# Patient Record
Sex: Male | Born: 1935 | Race: Black or African American | Hispanic: No | Smoking: Former smoker
Health system: Southern US, Community
[De-identification: ages and names within clinical notes are randomized; demographics above are authoritative.]

## PROBLEM LIST (undated history)

## (undated) DIAGNOSIS — J8410 Pulmonary fibrosis, unspecified: Secondary | ICD-10-CM

## (undated) DIAGNOSIS — I10 Essential (primary) hypertension: Secondary | ICD-10-CM

## (undated) DIAGNOSIS — R413 Other amnesia: Secondary | ICD-10-CM

## (undated) DIAGNOSIS — M109 Gout, unspecified: Secondary | ICD-10-CM

## (undated) DIAGNOSIS — Z950 Presence of cardiac pacemaker: Secondary | ICD-10-CM

## (undated) DIAGNOSIS — N400 Enlarged prostate without lower urinary tract symptoms: Secondary | ICD-10-CM

## (undated) DIAGNOSIS — I495 Sick sinus syndrome: Secondary | ICD-10-CM

## (undated) DIAGNOSIS — N189 Chronic kidney disease, unspecified: Secondary | ICD-10-CM

## (undated) DIAGNOSIS — D649 Anemia, unspecified: Secondary | ICD-10-CM

## (undated) DIAGNOSIS — R001 Bradycardia, unspecified: Secondary | ICD-10-CM

## (undated) DIAGNOSIS — I509 Heart failure, unspecified: Secondary | ICD-10-CM

## (undated) HISTORY — DX: Heart failure, unspecified: I50.9

## (undated) HISTORY — PX: CATARACT EXTRACTION: SUR2

## (undated) HISTORY — DX: Presence of cardiac pacemaker: Z95.0

## (undated) HISTORY — PX: PACEMAKER INSERTION: SHX728

## (undated) HISTORY — DX: Other amnesia: R41.3

---

## 2008-01-25 ENCOUNTER — Ambulatory Visit: Payer: Self-pay | Admitting: Family Medicine

## 2008-02-11 ENCOUNTER — Ambulatory Visit: Payer: Self-pay | Admitting: Gastroenterology

## 2008-10-03 DIAGNOSIS — Z950 Presence of cardiac pacemaker: Secondary | ICD-10-CM

## 2008-10-03 HISTORY — DX: Presence of cardiac pacemaker: Z95.0

## 2008-11-08 ENCOUNTER — Emergency Department: Payer: Self-pay | Admitting: Emergency Medicine

## 2008-11-14 ENCOUNTER — Inpatient Hospital Stay: Payer: Self-pay | Admitting: Cardiology

## 2008-12-04 ENCOUNTER — Emergency Department: Payer: Self-pay | Admitting: Emergency Medicine

## 2009-04-28 ENCOUNTER — Ambulatory Visit: Payer: Self-pay | Admitting: Urology

## 2009-10-23 ENCOUNTER — Emergency Department: Payer: Self-pay | Admitting: Emergency Medicine

## 2010-09-13 ENCOUNTER — Emergency Department: Payer: Self-pay | Admitting: Emergency Medicine

## 2010-11-02 ENCOUNTER — Ambulatory Visit: Payer: Self-pay | Admitting: Urology

## 2011-03-25 ENCOUNTER — Ambulatory Visit: Payer: Self-pay | Admitting: Gastroenterology

## 2011-03-29 LAB — PATHOLOGY REPORT

## 2012-02-07 ENCOUNTER — Emergency Department: Payer: Self-pay | Admitting: Emergency Medicine

## 2012-02-07 LAB — COMPREHENSIVE METABOLIC PANEL
Anion Gap: 5 — ABNORMAL LOW (ref 7–16)
BUN: 13 mg/dL (ref 7–18)
Bilirubin,Total: 0.3 mg/dL (ref 0.2–1.0)
Chloride: 110 mmol/L — ABNORMAL HIGH (ref 98–107)
Co2: 28 mmol/L (ref 21–32)
EGFR (Non-African Amer.): >60
Glucose: 96 mg/dL (ref 65–99)
Osmolality: 285 (ref 275–301)
Potassium: 3.6 mmol/L (ref 3.5–5.1)
SGPT (ALT): 74 U/L
Sodium: 143 mmol/L (ref 136–145)
Total Protein: 7.5 g/dL (ref 6.4–8.2)

## 2012-02-07 LAB — CBC
MCH: 22.3 pg — ABNORMAL LOW (ref 26.0–34.0)
MCHC: 30.8 g/dL — ABNORMAL LOW (ref 32.0–36.0)
Platelet: 157 10*3/uL (ref 150–440)

## 2012-02-13 LAB — CULTURE, BLOOD (SINGLE)

## 2012-04-17 ENCOUNTER — Ambulatory Visit: Payer: Self-pay | Admitting: Urology

## 2012-04-17 LAB — CBC WITH DIFFERENTIAL/PLATELET
Basophil #: 0 10*3/uL (ref 0.0–0.1)
Eosinophil #: 0.2 10*3/uL (ref 0.0–0.7)
Eosinophil %: 3.6 %
Lymphocyte #: 1.4 10*3/uL (ref 1.0–3.6)
MCH: 22.2 pg — ABNORMAL LOW (ref 26.0–34.0)
MCHC: 30.9 g/dL — ABNORMAL LOW (ref 32.0–36.0)
MCV: 72 fL — ABNORMAL LOW (ref 80–100)
Monocyte #: 0.5 x10 3/mm (ref 0.2–1.0)
Neutrophil #: 2.2 10*3/uL (ref 1.4–6.5)
Neutrophil %: 51 %
Platelet: 113 10*3/uL — ABNORMAL LOW (ref 150–440)
RDW: 15.9 % — ABNORMAL HIGH (ref 11.5–14.5)

## 2012-04-23 ENCOUNTER — Ambulatory Visit: Payer: Self-pay | Admitting: Urology

## 2014-02-19 ENCOUNTER — Emergency Department: Payer: Self-pay | Admitting: Emergency Medicine

## 2014-03-28 DIAGNOSIS — J8410 Pulmonary fibrosis, unspecified: Secondary | ICD-10-CM | POA: Insufficient documentation

## 2014-03-28 DIAGNOSIS — J449 Chronic obstructive pulmonary disease, unspecified: Secondary | ICD-10-CM | POA: Insufficient documentation

## 2015-01-20 NOTE — H&P (Signed)
PATIENT NAME:  Derek Bernard, Derek Bernard MR#:  [id number] DATE OF BIRTH:  August 31, 1930  DATE OF ADMISSION:  04/23/2012  CHIEF COMPLAINT: Difficulty voiding.   HISTORY OF PRESENT ILLNESS: Mr. Derek Bernard is an 81 year old African American male with significant lower urinary tract symptoms who underwent extensive urologic evaluation including a uroflow study and cystoscopy which indicated the presence of bladder outlet obstruction. Specifically cystoscopy indicated bladder neck contracture of approximately 20 French in size and a stone lodged in the prostatic fossa which I had pushed back into the bladder. He comes in now for photovaporization of the bladder neck contracture and lithotripsy of the bladder stone.   ALLERGIES: Patient had no known drug allergies.   CURRENT MEDICATIONS:  1. Omeprazole. 2. Dorzolamide. 3. Lasix. 4. Pravastatin.   PAST SURGICAL HISTORY:  1. Cardiac pacemaker placement 2010. 2. Right inguinal herniorrhaphy 1994.  3. Right cataract surgery 2002. 4. EVOLVE laser procedure of the prostate 2008.   SOCIAL HISTORY: Patient denied tobacco or alcohol use.   FAMILY HISTORY: Negative for kidney disease or prostate cancer.   PAST AND CURRENT MEDICAL CONDITIONS:  1. History of cardiac arrhythmia controlled with pacemaker.  2. Hypercholesterolemia.  3. Gastroesophageal reflux disease.  4. Hypertension.   REVIEW OF SYSTEMS: Patient denied chest pain, shortness of breath, diabetes, or stroke.   PHYSICAL EXAMINATION:  GENERAL: Well-nourished African American male in no acute distress.   HEENT: Sclerae were clear. Pupils were equally round, reactive to light. Extraocular movements were intact.   NECK: Supple. No palpable cervical adenopathy. No audible carotid bruits.   LUNGS: Clear to auscultation.   CARDIOVASCULAR: Regular rhythm and rate without audible murmurs.   ABDOMEN: Soft, nontender abdomen.   GENITOURINARY: Uncircumcised. Testes atrophic.   RECTAL: 40 gram smooth  nontender prostate.   NEUROMUSCULAR: Nonfocal.   IMPRESSION:  1. Bladder outlet obstruction due to bladder neck contracture.  2. Bladder stone.   PLAN:  1. Transurethral resection of bladder neck with the GreenLight laser. 2. Lithotripsy of bladder stone.   ____________________________ Suszanne ConnersMichael R. Evelene CroonWolff, MD mrw:cms D: 04/17/2012 13:28:24 ET T: 04/17/2012 13:47:31 ET JOB#: [id number]  cc: Suszanne ConnersMichael R. Evelene CroonWolff, MD, <Dictator> Orson ApeMICHAEL R Raelea Gosse MD ELECTRONICALLY SIGNED 04/18/2012 16:58

## 2015-01-20 NOTE — Op Note (Signed)
PATIENT NAME:  Derek Bernard, Derek Bernard MR#:  [id number] DATE OF BIRTH:  26-Jun-1930  DATE OF PROCEDURE:  04/23/2012  PREOPERATIVE DIAGNOSIS: Bladder outlet obstruction due to bladder neck contracture and benign prostatic hypertrophy.   POSTOPERATIVE DIAGNOSIS: Bladder outlet obstruction due to bladder neck contracture and benign prostatic hypertrophy.  PROCEDURE PERFORMED: Photovaporization of the prostate and bladder neck contracture with GreenLight laser.   SURGEON: Suszanne ConnersMichael R. Evelene CroonWolff, MD  ANESTHETIST: Darleene CleaverVan Staveren.   ANESTHETIC METHOD: General.   INDICATIONS: See the dictated history and physical. After informed consent, patient requests the above procedure.   OPERATIVE SUMMARY: After adequate general anesthesia had been obtained, patient was placed into dorsal lithotomy position and the laser scope was coupled with the camera and visually advanced into the bladder. Bladder was thoroughly inspected. No bladder tumors, stones, or lesions were identified. Bladder was moderately trabeculated. Both ureteral orifices were identified and had clear efflux. Patient had approximately 20 French bladder neck contracture. He also had some regrowth of benign prostatic hypertrophy tissue laterally. At this point, the XPS laser fiber was introduced through the scope and power set at 80 watts. Bladder neck contracture was incised at the 3:00 and 9:00 positions. Obstructing benign prostatic hypertrophy tissue was vaporized laterally. At this point scope was removed and a 20 JamaicaFrench Foley catheter inserted. Catheter was irrigated until clear. A B and O suppository was placed. Procedure was then terminated and the patient was transferred to the recovery room in stable condition.   ____________________________ Suszanne ConnersMichael R. Evelene CroonWolff, MD mrw:cms D: 04/23/2012 12:21:07 ET T: 04/23/2012 12:32:46 ET  JOB#: [id number] Chimere Klingensmith R Othman Masur MD ELECTRONICALLY SIGNED 04/23/2012 14:06

## 2015-01-29 ENCOUNTER — Emergency Department
Admit: 2015-01-29 | Disposition: A | Discharge status: Home / Self Care | Payer: Self-pay | Admitting: Emergency Medicine

## 2015-06-25 DIAGNOSIS — I495 Sick sinus syndrome: Secondary | ICD-10-CM | POA: Insufficient documentation

## 2015-12-10 ENCOUNTER — Other Ambulatory Visit: Payer: Self-pay | Admitting: Specialist

## 2015-12-10 DIAGNOSIS — J8410 Pulmonary fibrosis, unspecified: Secondary | ICD-10-CM

## 2015-12-22 ENCOUNTER — Inpatient Hospital Stay
Admission: EM | Admit: 2015-12-22 | Discharge: 2015-12-23 | DRG: 558 | Disposition: A | Discharge status: Home / Self Care | Payer: Medicare HMO | Attending: Internal Medicine | Admitting: Internal Medicine

## 2015-12-22 ENCOUNTER — Encounter: Payer: Self-pay | Admitting: Emergency Medicine

## 2015-12-22 ENCOUNTER — Emergency Department: Payer: Medicare HMO

## 2015-12-22 DIAGNOSIS — Z87891 Personal history of nicotine dependence: Secondary | ICD-10-CM | POA: Diagnosis not present

## 2015-12-22 DIAGNOSIS — M79602 Pain in left arm: Secondary | ICD-10-CM

## 2015-12-22 DIAGNOSIS — M779 Enthesopathy, unspecified: Principal | ICD-10-CM | POA: Diagnosis present

## 2015-12-22 DIAGNOSIS — I129 Hypertensive chronic kidney disease with stage 1 through stage 4 chronic kidney disease, or unspecified chronic kidney disease: Secondary | ICD-10-CM | POA: Diagnosis present

## 2015-12-22 DIAGNOSIS — H409 Unspecified glaucoma: Secondary | ICD-10-CM | POA: Diagnosis present

## 2015-12-22 DIAGNOSIS — I252 Old myocardial infarction: Secondary | ICD-10-CM | POA: Diagnosis not present

## 2015-12-22 DIAGNOSIS — I249 Acute ischemic heart disease, unspecified: Secondary | ICD-10-CM

## 2015-12-22 DIAGNOSIS — E782 Mixed hyperlipidemia: Secondary | ICD-10-CM | POA: Diagnosis present

## 2015-12-22 DIAGNOSIS — N189 Chronic kidney disease, unspecified: Secondary | ICD-10-CM | POA: Diagnosis present

## 2015-12-22 DIAGNOSIS — K219 Gastro-esophageal reflux disease without esophagitis: Secondary | ICD-10-CM | POA: Diagnosis present

## 2015-12-22 DIAGNOSIS — N400 Enlarged prostate without lower urinary tract symptoms: Secondary | ICD-10-CM | POA: Diagnosis present

## 2015-12-22 DIAGNOSIS — D649 Anemia, unspecified: Secondary | ICD-10-CM | POA: Diagnosis present

## 2015-12-22 DIAGNOSIS — M109 Gout, unspecified: Secondary | ICD-10-CM | POA: Diagnosis present

## 2015-12-22 DIAGNOSIS — R748 Abnormal levels of other serum enzymes: Secondary | ICD-10-CM | POA: Diagnosis present

## 2015-12-22 DIAGNOSIS — Z79899 Other long term (current) drug therapy: Secondary | ICD-10-CM

## 2015-12-22 DIAGNOSIS — Z950 Presence of cardiac pacemaker: Secondary | ICD-10-CM

## 2015-12-22 DIAGNOSIS — J8410 Pulmonary fibrosis, unspecified: Secondary | ICD-10-CM | POA: Diagnosis present

## 2015-12-22 DIAGNOSIS — Z7982 Long term (current) use of aspirin: Secondary | ICD-10-CM

## 2015-12-22 DIAGNOSIS — I2510 Atherosclerotic heart disease of native coronary artery without angina pectoris: Secondary | ICD-10-CM | POA: Diagnosis present

## 2015-12-22 DIAGNOSIS — R001 Bradycardia, unspecified: Secondary | ICD-10-CM | POA: Diagnosis present

## 2015-12-22 DIAGNOSIS — I214 Non-ST elevation (NSTEMI) myocardial infarction: Secondary | ICD-10-CM

## 2015-12-22 HISTORY — DX: Bradycardia, unspecified: R00.1

## 2015-12-22 HISTORY — DX: Benign prostatic hyperplasia without lower urinary tract symptoms: N40.0

## 2015-12-22 HISTORY — DX: Anemia, unspecified: D64.9

## 2015-12-22 HISTORY — DX: Gout, unspecified: M10.9

## 2015-12-22 HISTORY — DX: Chronic kidney disease, unspecified: N18.9

## 2015-12-22 HISTORY — DX: Essential (primary) hypertension: I10

## 2015-12-22 HISTORY — DX: Pulmonary fibrosis, unspecified: J84.10

## 2015-12-22 HISTORY — DX: Sick sinus syndrome: I49.5

## 2015-12-22 LAB — CBC
HEMATOCRIT: 40 % (ref 40.0–52.0)
Hemoglobin: 12.7 g/dL — ABNORMAL LOW (ref 13.0–18.0)
MCH: 22.8 pg — ABNORMAL LOW (ref 26.0–34.0)
MCHC: 31.7 g/dL — ABNORMAL LOW (ref 32.0–36.0)
MCV: 71.8 fL — ABNORMAL LOW (ref 80.0–100.0)
Platelets: 99 10*3/uL — ABNORMAL LOW (ref 150–440)
RBC: 5.57 MIL/uL (ref 4.40–5.90)
RDW: 16.2 % — AB (ref 11.5–14.5)
WBC: 6.2 10*3/uL (ref 3.8–10.6)

## 2015-12-22 LAB — LIPID PANEL
CHOLESTEROL: 180 mg/dL (ref 0–200)
HDL: 81 mg/dL (ref >40)
LDL CALC: 92 mg/dL (ref 0–99)
TRIGLYCERIDES: 35 mg/dL (ref <150)
Total CHOL/HDL Ratio: 2.2 RATIO
VLDL: 7 mg/dL (ref 0–40)

## 2015-12-22 LAB — BASIC METABOLIC PANEL
ANION GAP: 5 (ref 5–15)
BUN: 19 mg/dL (ref 6–20)
CHLORIDE: 110 mmol/L (ref 101–111)
CO2: 22 mmol/L (ref 22–32)
CREATININE: 0.98 mg/dL (ref 0.61–1.24)
Calcium: 9 mg/dL (ref 8.9–10.3)
GFR calc non Af Amer: >60 mL/min (ref >60)
Glucose, Bld: 110 mg/dL — ABNORMAL HIGH (ref 65–99)
POTASSIUM: 3.7 mmol/L (ref 3.5–5.1)
SODIUM: 137 mmol/L (ref 135–145)

## 2015-12-22 LAB — PROTIME-INR
INR: 1.05
PROTHROMBIN TIME: 13.9 s (ref 11.4–15.0)

## 2015-12-22 LAB — APTT: aPTT: 29 seconds (ref 24–36)

## 2015-12-22 LAB — TROPONIN I
TROPONIN I: 0.13 ng/mL — AB (ref <0.031)
Troponin I: 0.14 ng/mL — ABNORMAL HIGH (ref <0.031)

## 2015-12-22 MED ORDER — HEPARIN (PORCINE) IN NACL 100-0.45 UNIT/ML-% IJ SOLN
950.0000 [IU]/h | INTRAMUSCULAR | Status: DC
  Administered 2015-12-22: 950 [IU]/h via INTRAVENOUS
  Filled 2015-12-22 (×2): qty 250

## 2015-12-22 MED ORDER — ONDANSETRON HCL 4 MG/2ML IJ SOLN
4.0000 mg | Freq: Once | INTRAMUSCULAR | Status: AC
  Administered 2015-12-22: 4 mg via INTRAVENOUS
  Filled 2015-12-22: qty 2

## 2015-12-22 MED ORDER — DORZOLAMIDE HCL-TIMOLOL MAL 2-0.5 % OP SOLN
1.0000 [drp] | Freq: Two times a day (BID) | OPHTHALMIC | Status: DC
  Administered 2015-12-22 – 2015-12-23 (×2): 1 [drp] via OPHTHALMIC
  Filled 2015-12-22: qty 10

## 2015-12-22 MED ORDER — PRAVASTATIN SODIUM 20 MG PO TABS
40.0000 mg | ORAL_TABLET | Freq: Every day | ORAL | Status: DC
  Administered 2015-12-22: 40 mg via ORAL
  Filled 2015-12-22: qty 2

## 2015-12-22 MED ORDER — PANTOPRAZOLE SODIUM 40 MG PO TBEC
40.0000 mg | DELAYED_RELEASE_TABLET | Freq: Every day | ORAL | Status: DC
  Administered 2015-12-23: 40 mg via ORAL
  Filled 2015-12-22: qty 1

## 2015-12-22 MED ORDER — NITROGLYCERIN 2 % TD OINT: 0.5000 [in_us] | TOPICAL_OINTMENT | Freq: Four times a day (QID) | TRANSDERMAL | Status: DC

## 2015-12-22 MED ORDER — ZOLPIDEM TARTRATE 5 MG PO TABS: 5.0000 mg | ORAL_TABLET | Freq: Every evening | ORAL | Status: DC | PRN

## 2015-12-22 MED ORDER — BRIMONIDINE TARTRATE 0.15 % OP SOLN
1.0000 [drp] | Freq: Three times a day (TID) | OPHTHALMIC | Status: DC
  Administered 2015-12-22 – 2015-12-23 (×2): 1 [drp] via OPHTHALMIC
  Filled 2015-12-22: qty 5

## 2015-12-22 MED ORDER — HEPARIN BOLUS VIA INFUSION
3000.0000 [IU] | Freq: Once | INTRAVENOUS | Status: AC
  Administered 2015-12-22: 3000 [IU] via INTRAVENOUS
  Filled 2015-12-22: qty 3000

## 2015-12-22 MED ORDER — SODIUM CHLORIDE 0.9 % IV SOLN
INTRAVENOUS | Status: DC
  Administered 2015-12-22: 23:00:00 via INTRAVENOUS

## 2015-12-22 MED ORDER — HYDROCODONE-ACETAMINOPHEN 5-325 MG PO TABS
1.0000 | ORAL_TABLET | ORAL | Status: DC | PRN
  Administered 2015-12-22: 2 via ORAL
  Administered 2015-12-23: 1 via ORAL
  Filled 2015-12-22: qty 1
  Filled 2015-12-22: qty 2

## 2015-12-22 MED ORDER — ASPIRIN 81 MG PO CHEW
324.0000 mg | CHEWABLE_TABLET | Freq: Once | ORAL | Status: AC
  Administered 2015-12-22: 324 mg via ORAL
  Filled 2015-12-22: qty 4

## 2015-12-22 MED ORDER — ONDANSETRON HCL 4 MG PO TABS: 4.0000 mg | ORAL_TABLET | Freq: Four times a day (QID) | ORAL | Status: DC | PRN

## 2015-12-22 MED ORDER — ONDANSETRON HCL 4 MG/2ML IJ SOLN: 4.0000 mg | Freq: Four times a day (QID) | INTRAMUSCULAR | Status: DC | PRN

## 2015-12-22 MED ORDER — MONTELUKAST SODIUM 10 MG PO TABS
10.0000 mg | ORAL_TABLET | Freq: Every day | ORAL | Status: DC
  Administered 2015-12-23: 10 mg via ORAL
  Filled 2015-12-22: qty 1

## 2015-12-22 MED ORDER — MOMETASONE FURO-FORMOTEROL FUM 200-5 MCG/ACT IN AERO
2.0000 | INHALATION_SPRAY | Freq: Two times a day (BID) | RESPIRATORY_TRACT | Status: DC
  Administered 2015-12-22 – 2015-12-23 (×2): 2 via RESPIRATORY_TRACT
  Filled 2015-12-22: qty 8.8

## 2015-12-22 MED ORDER — SODIUM CHLORIDE 0.9% FLUSH
3.0000 mL | Freq: Two times a day (BID) | INTRAVENOUS | Status: DC
  Administered 2015-12-22: 3 mL via INTRAVENOUS

## 2015-12-22 MED ORDER — ACETAMINOPHEN 650 MG RE SUPP: 650.0000 mg | Freq: Four times a day (QID) | RECTAL | Status: DC | PRN

## 2015-12-22 MED ORDER — FLUTICASONE PROPIONATE 50 MCG/ACT NA SUSP
2.0000 | Freq: Every day | NASAL | Status: DC
  Filled 2015-12-22: qty 16

## 2015-12-22 MED ORDER — ASPIRIN EC 81 MG PO TBEC
81.0000 mg | DELAYED_RELEASE_TABLET | Freq: Every day | ORAL | Status: DC
  Administered 2015-12-23: 81 mg via ORAL
  Filled 2015-12-22: qty 1

## 2015-12-22 MED ORDER — ACETAMINOPHEN 325 MG PO TABS: 650.0000 mg | ORAL_TABLET | Freq: Four times a day (QID) | ORAL | Status: DC | PRN

## 2015-12-22 MED ORDER — LORATADINE 10 MG PO TABS
10.0000 mg | ORAL_TABLET | Freq: Every day | ORAL | Status: DC
  Administered 2015-12-23: 10 mg via ORAL
  Filled 2015-12-22: qty 1

## 2015-12-22 MED ORDER — LATANOPROST 0.005 % OP SOLN
1.0000 [drp] | Freq: Every day | OPHTHALMIC | Status: DC
  Administered 2015-12-22: 1 [drp] via OPHTHALMIC
  Filled 2015-12-22: qty 2.5

## 2015-12-22 MED ORDER — MORPHINE SULFATE (PF) 4 MG/ML IV SOLN
4.0000 mg | Freq: Once | INTRAVENOUS | Status: AC
  Administered 2015-12-22: 4 mg via INTRAVENOUS
  Filled 2015-12-22: qty 1

## 2015-12-22 MED ORDER — MORPHINE SULFATE (PF) 2 MG/ML IV SOLN: 2.0000 mg | INTRAVENOUS | Status: DC | PRN

## 2015-12-22 NOTE — ED Notes (Signed)
Troponin 0.13, Dr.Gayle notified, pt brought back to room 1

## 2015-12-22 NOTE — H&P (Signed)
[facility] [place] [facility]   PATIENT NAME: Derek DaltonWilliam Presas    MR#:  [id number]  DATE OF BIRTH:  01/19/1930  DATE OF ADMISSION:  12/22/2015  PRIMARY CARE PHYSICIAN: Dr. Ellsworth Lennoxejan-Sie  REQUESTING/REFERRING PHYSICIAN: Dr. Toney RakesEryka Gayle  CHIEF COMPLAINT:   Chief Complaint  Patient presents with  . Arm Pain    HISTORY OF PRESENT ILLNESS:  Derek Bernard  is a 82 y.o. male with a known history of retention, anemia, BPH, pulmonary fibrosis not on any home oxygen presents to the hospital secondary to diaphoresis and also left arm pain. -No prior cardiac history, no recent cardiac stress test was done. [facility]. Denies any chest pain, no shortness of breath, no nausea or vomiting. Started this morning as left arm pain, heavy mostly from elbow down. Also associated with significant diaphoresis. Denies any jaw pain, radiation to the back or chest pain. First Troponin here in the emergency room is elevated at 0.13. Patient is being admitted for possible NSTEMI  PAST MEDICAL HISTORY:   Past Medical History  Diagnosis Date  . Hypertension   . Anemia   . BPH (benign prostatic hyperplasia)   . CKD (chronic kidney disease)   . Pulmonary fibrosis (HCC)   . Sinus bradycardia   . Sick sinus syndrome [facility](HCC)     s/p pacemaker  . Gout     PAST SURGICAL HISTORY:   Past Surgical History  Procedure Laterality Date  . Pacemaker insertion    . Cataract extraction      Right eye    SOCIAL HISTORY:   Social History  Substance Use Topics  . Smoking status: Former Games developermoker  . Smokeless tobacco: Not on file     Comment: Quit 20 years ago  . Alcohol Use: No    FAMILY HISTORY:  No family history on file.  Doesn't know about his Dad Mother died at 80 years of age, no known medical history  DRUG ALLERGIES:  No Known Allergies  REVIEW OF SYSTEMS:   Review of Systems  Constitutional: Negative for  fever, chills, weight loss and malaise/fatigue.       Diaphoresis  HENT: Negative for ear discharge, ear pain, hearing loss, nosebleeds and tinnitus.   Eyes: Positive for blurred vision. Negative for double vision and photophobia.  Respiratory: Negative for cough, hemoptysis, shortness of breath and wheezing.   Cardiovascular: Negative for chest pain, palpitations, orthopnea and leg swelling.  Gastrointestinal: Negative for heartburn, nausea, vomiting, abdominal pain, diarrhea, constipation and melena.  Genitourinary: Negative for dysuria, urgency, frequency and hematuria.  Musculoskeletal: Negative for myalgias, back pain and neck pain.       Left arm pain  Skin: Negative for rash.  Neurological: Negative for dizziness, tingling, tremors, sensory change, speech change, focal weakness and headaches.  Endo/Heme/Allergies: Does not bruise/bleed easily.  Psychiatric/Behavioral: Negative for depression.    MEDICATIONS AT HOME:   Prior to Admission medications   Medication Sig Start Date End Date Taking? Authorizing Provider  aspirin EC 81 MG tablet Take 81 mg by mouth daily.   Yes Historical Provider, MD  brimonidine (ALPHAGAN P) 0.1 % SOLN Place 1 drop into both eyes 3 (three) times daily.   Yes Historical Provider, MD  budesonide-formoterol (SYMBICORT) 160-4.5 MCG/ACT inhaler Inhale 2 puffs into the lungs 2 (two) times daily.   Yes Historical Provider, MD  celecoxib (CELEBREX) 200 MG capsule Take 200 mg by mouth daily.   Yes  Historical Provider, MD  diclofenac sodium (VOLTAREN) 1 % GEL Apply 2 g topically 4 (four) times daily as needed (for pain).   Yes Historical Provider, MD  dorzolamide-timolol (COSOPT) 22.3-6.8 MG/ML ophthalmic solution Place 1 drop into both eyes 2 (two) times daily.   Yes Historical Provider, MD  fexofenadine (ALLEGRA) 180 MG tablet Take 180 mg by mouth daily.   Yes Historical Provider, MD  fluticasone (FLONASE) 50 MCG/ACT nasal spray Place 2 sprays into both nostrils  daily.   Yes Historical Provider, MD  furosemide (LASIX) 20 MG tablet Take 20 mg by mouth daily.   Yes Historical Provider, MD  latanoprost (XALATAN) 0.005 % ophthalmic solution Place 1 drop into both eyes at bedtime.   Yes Historical Provider, MD  montelukast (SINGULAIR) 10 MG tablet Take 10 mg by mouth daily.   Yes Historical Provider, MD  omeprazole (PRILOSEC) 20 MG capsule Take 40 mg by mouth daily before breakfast.   Yes Historical Provider, MD  potassium chloride (K-DUR) 10 MEQ tablet Take 10 mEq by mouth daily.   Yes Historical Provider, MD  pravastatin (PRAVACHOL) 40 MG tablet Take 40 mg by mouth at bedtime.   Yes Historical Provider, MD  zolpidem (AMBIEN) 5 MG tablet Take 5 mg by mouth at bedtime as needed for sleep.   Yes Historical Provider, MD      VITAL SIGNS:  Blood pressure 139/73, pulse 61, temperature 98 F (36.7 C), temperature source Oral, resp. rate 16, height  (1.727 m), weight 78.472 kg (173 lb), SpO2 99 %.  PHYSICAL EXAMINATION:   Physical Exam  GENERAL:  82 y.o.-year-old patient lying in the bed with no acute distress.  EYES: Pupils equal, round, reactive to light and accommodation. No scleral icterus. Extraocular muscles intact.  HEENT: Head atraumatic, normocephalic. Oropharynx and nasopharynx clear.  NECK:  Supple, no jugular venous distention. No thyroid enlargement, no tenderness.  LUNGS: Normal breath sounds bilaterally, no wheezing, rales,rhonchi or crepitation. No use of accessory muscles of respiration.  CARDIOVASCULAR: S1, S2 normal. No murmurs, rubs, or gallops. Pacemaker in place on left chest.  ABDOMEN: Soft, nontender, nondistended. Bowel sounds present. No organomegaly or mass.  EXTREMITIES: No pedal edema, cyanosis, or clubbing.  NEUROLOGIC: Cranial nerves II through XII are intact. Muscle strength 5/5 in all extremities. Sensation intact. Gait not checked.  PSYCHIATRIC: The patient is alert and oriented x 3.  SKIN: No obvious rash, lesion,  or ulcer.   LABORATORY PANEL:   CBC  Recent Labs Lab 12/22/15 1603  WBC 6.2  HGB 12.7*  HCT 40.0  PLT 99*   ------------------------------------------------------------------------------------------------------------------  Chemistries   Recent Labs Lab 12/22/15 1603  NA 137  K 3.7  CL 110  CO2 22  GLUCOSE 110*  BUN 19  CREATININE 0.98  CALCIUM 9.0   ------------------------------------------------------------------------------------------------------------------  Cardiac Enzymes  Recent Labs Lab 12/22/15 1603  TROPONINI 0.13*   ------------------------------------------------------------------------------------------------------------------  RADIOLOGY:  Dg Chest 2 View  12/22/2015  CLINICAL DATA:  Left arm pain beginning yesterday. Diaphoresis. No known injury. Initial encounter. EXAM: CHEST  2 VIEW COMPARISON:  CT chest and PA and lateral chest 02/07/2012. FINDINGS: Pacing device is in place, unchanged. Heart size is normal. Pulmonary fibrosis appearing worst in the lung bases has progressed since the prior study. No consolidative process, pneumothorax or effusion. IMPRESSION: Progressed pulmonary fibrosis.  No acute disease. Electronically Signed   By: Drusilla Kanner M.D.   On: 12/22/2015 16:44    EKG:   Orders placed or performed during the  hospital encounter of 12/22/15  . EKG 12-Lead  . EKG 12-Lead  . ED EKG within 10 minutes  . ED EKG within 10 minutes    IMPRESSION AND PLAN:   Ivie Maese  is a 82 y.o. male with a known history of retention, anemia, BPH, pulmonary fibrosis not on any home oxygen presents to the hospital secondary to diaphoresis and also left arm pain.  #1 NSTEMI-  atypical presentation. First troponin is elevated. -Admit to telemetry. Cardiology notified. Start on IV heparin drip. -Recycle troponins. Keep nothing by mouth after midnight. -Started aspirin, check lipid panel. Already on statin  #2 glaucoma-continue all his  eyedrops  #3 pulmonary fibrosis-follows with pulmonology as outpatient. Stable and not on home oxygen. -Continue his home medications.  #4 DVT prophylaxis-on IV heparin    All the records are reviewed and case discussed with ED provider. Management plans discussed with the patient, family and they are in agreement.  CODE STATUS: Full Code  TOTAL TIME TAKING CARE OF THIS PATIENT: 55 minutes.    Enid Baas M.D on 12/22/2015 at 5:40 PM  Between 7am to 6pm - Pager - [phone]  After 6pm go to [email or web address] - password [email or web address] [facility]  [facility]  Office  [phone]  CC: Primary care physician; No primary care provider on file.

## 2015-12-22 NOTE — Progress Notes (Signed)
ANTICOAGULATION CONSULT NOTE - Initial Consult  Pharmacy Consult for heparin drip Indication: chest pain/ACS  No Known Allergies  Patient Measurements: Height: 5\' 8"  (172.7 cm) Weight: 173 lb (78.472 kg) IBW/kg (Calculated) : 68.4 Heparin Dosing Weight: 78.5 kg  Vital Signs: Temp: 97.3 F (36.3 C) (03/21 2039) Temp Source: Oral (03/21 2039) BP: 155/66 mmHg (03/21 2039) Pulse Rate: 59 (03/21 2039)  Labs:  Recent Labs  12/22/15 1603  HGB 12.7*  HCT 40.0  PLT 99*  CREATININE 0.98  TROPONINI 0.13*    Estimated Creatinine Clearance: 55.3 mL/min (by C-G formula based on Cr of 0.98).   Medical History: Past Medical History  Diagnosis Date  . Hypertension   . Anemia   . BPH (benign prostatic hyperplasia)   . CKD (chronic kidney disease)   . Pulmonary fibrosis (HCC)   . Sinus bradycardia   . Sick sinus syndrome [facility](HCC)     s/p pacemaker  . Gout     Assessment: Pharmacy consulted to dose and monitor heparin drip in this 82 year old male for ACS/NSTEMI protocol. Patient was not taking anticoagulants prior to admission. Baseline labs ordered.  Hgb: 12.7 Plt 99 Troponin 0.13  APTT and INR ordered  Goal of Therapy:  Heparin level 0.3-0.7 units/ml Monitor platelets by anticoagulation protocol: Yes   Plan:  Give 3000 units bolus x 1 Start heparin infusion at 950 units/hr Check anti-Xa level in 8 hours and daily while on heparin Continue to monitor H&H and platelets  Cindi CarbonMary M Yazeed Pryer, PharmD 12/22/2015,8:50 PM

## 2015-12-22 NOTE — ED Notes (Signed)
Pt taken to Room 260 by this Tech.

## 2015-12-22 NOTE — Progress Notes (Signed)
Patient admitted to room 260 with the diagnosis of NSTEMI. Alert and oriented x 4. Patient oriented to his room, staff, call bell/ascom. Tele applied and the box verified by the RN and Christine NT. Fall Dispensing opticiancontract signed.  Moderate fall risk. Bed in the lowest position. Skin assessment done with Anson CroftsAdrienne W. RN, no skin issues noted but dry and flaky feet. Will continue to monitor.

## 2015-12-22 NOTE — ED Notes (Signed)
Attempted to call report to floor.  Nurse unavailable to take report in patient's room at this time.

## 2015-12-22 NOTE — ED Notes (Signed)
Pt reports pain in left arm that started yesterday; reports today pain was causing diaphoresis. Pt alert and oriented in triage.

## 2015-12-22 NOTE — ED Provider Notes (Signed)
[facility] Emergency Department Provider Note  ____________________________________________  Time seen: Approximately 4:54 PM  I have reviewed the triage vital signs and the nursing notes.   HISTORY  Chief Complaint Arm Pain    HPI Derek Bernard is a 82 y.o. male history of hypertension, sick sinus syndrome status post pacemaker placement, chronic kidney disease, COPD, gout, hyperlipidemia who presents for evaluation of worsening left arm soreness today associated with sudden sweating, gradual onset, constant but improved, worse with exertion/movement. Patient reports that intermittently over the past several days he has had pain and soreness in the left arm. He denies any trauma, numbness or weakness to the arm. Today at approximately 2 PM his pain was suddenly worse and he became diaphoretic. Currently his pain is 2 out of 10. He denies any vomiting, diarrhea, fevers or chills. He denies any specific chest pain at any time.   Past Medical History  Diagnosis Date  . Hypertension     There are no active problems to display for this patient.   Past Surgical History  Procedure Laterality Date  . Pacemaker insertion      No current outpatient prescriptions on file.  Allergies Review of patient's allergies indicates no known allergies.  No family history on file.  Social History Social History  Substance Use Topics  . Smoking status: Never Smoker   . Smokeless tobacco: None  . Alcohol Use: No    Review of Systems Constitutional: No fever/chills Eyes: No visual changes. ENT: No sore throat. Cardiovascular: Denies chest pain. Respiratory: Denies shortness of breath. Gastrointestinal: No abdominal pain.  No nausea, no vomiting.  No diarrhea.  No constipation. Genitourinary: Negative for dysuria. Musculoskeletal: Negative for back pain. Skin: Negative for rash. Neurological: Negative for headaches, focal weakness or numbness.  10-point ROS  otherwise negative.  ____________________________________________   PHYSICAL EXAM:  VITAL SIGNS: ED Triage Vitals  Enc Vitals Group     BP 12/22/15 1601 139/73 mmHg     Pulse Rate 12/22/15 1601 61     Resp 12/22/15 1601 16     Temp 12/22/15 1601 98 F (36.7 C)     Temp Source 12/22/15 1601 Oral     SpO2 12/22/15 1601 99 %     Weight 12/22/15 1601 173 lb (78.472 kg)     Height 12/22/15 1601 5\' 8"  (1.727 m)     Head Cir --      Peak Flow --      Pain Score 12/22/15 1559 9     Pain Loc --      Pain Edu? --      Excl. in GC? --     Constitutional: Alert and oriented. Well appearing and in no acute distress. Eyes: Conjunctivae are normal. PERRL. EOMI. Head: Atraumatic. Nose: No congestion/rhinnorhea. Mouth/Throat: Mucous membranes are moist.  Oropharynx non-erythematous. Neck: No stridor. Supple without meningismus. Cardiovascular: Normal rate, regular rhythm. Grossly normal heart sounds.  Good peripheral circulation. Respiratory: Normal respiratory effort.  No retractions. Lungs CTAB. Gastrointestinal: Soft and nontender. No distention. No CVA tenderness. Genitourinary: deferred Musculoskeletal: No lower extremity tenderness nor edema.  No joint effusions. No tenderness to palpation throughout the left arm, 2+ left radial pulse, left radial, median and ulnar nerve are intact. Mild pain with extreme flexion at the elbow however the patient allows full passive range of motion at the elbow and the shoulder on the left. Neurologic:  Normal speech and language. No gross focal neurologic deficits are appreciated. No gait  instability. Skin:  Skin is warm, dry and intact. No rash noted. Psychiatric: Mood and affect are normal. Speech and behavior are normal.  ____________________________________________   LABS (all labs ordered are listed, but only abnormal results are displayed)  Labs Reviewed  BASIC METABOLIC PANEL - Abnormal; Notable for the following:    Glucose, Bld 110 (*)     All other components within normal limits  TROPONIN I - Abnormal; Notable for the following:    Troponin I 0.13 (*)    All other components within normal limits  CBC - Abnormal; Notable for the following:    Hemoglobin 12.7 (*)    MCV 71.8 (*)    MCH 22.8 (*)    MCHC 31.7 (*)    RDW 16.2 (*)    Platelets 99 (*)    All other components within normal limits   ____________________________________________  EKG  ED ECG REPORT I, Gayla Doss, the attending physician, personally viewed and interpreted this ECG.   Date: 12/22/2015  EKG Time: 16:00  Rate: 67  Rhythm: Atrial paced rhythm with occasional PVCs  Axis: normal  Intervals:right bundle branch block  ST&T Change: No acute ST elevation noted. Paced rhythm with PVCs.  ____________________________________________  RADIOLOGY  CXR IMPRESSION: Progressed pulmonary fibrosis. No acute disease.   ____________________________________________   PROCEDURES  Procedure(s) performed: None  Critical Care performed:   ____________________________________________   INITIAL IMPRESSION / ASSESSMENT AND PLAN / ED COURSE  Pertinent labs & imaging results that were available during my care of the patient were reviewed by me and considered in my medical decision making (see chart for details).  Derek Bernard is a 82 y.o. male history of hypertension, sick sinus syndrome status post pacemaker placement, chronic kidney disease, COPD, gout, hyperlipidemia who presents for evaluation of worsening left arm soreness today associated with sudden sweating, gradual onset, constant but improved, worse with exertion/movement. On exam, he is nontoxic appearing and in no acute distress. Vital signs stable, he is afebrile. EKG shows paced rhythm with PVCs. BMP unremarkable. CBC with mild anemia. Troponin is elevated at 0.13 and I'm concerned his symptoms may represent atypical presentation for ACS. Chest x-ray clear. Pain improved to 2 out  of 10 at this time, we'll treat with aspirin, morphine, nitroglycerin as needed and anticipate admission.  ----------------------------------------- 5:41 PM on 12/22/2015 -----------------------------------------  Case discussed with hospitalist for admission for same. ____________________________________________   FINAL CLINICAL IMPRESSION(S) / ED DIAGNOSES  Final diagnoses:  Pain of left upper extremity  ACS (acute coronary syndrome) (HCC)  Non-ST elevation (NSTEMI) myocardial infarction (HCC)      Gayla Doss, MD 12/22/15 [phone]

## 2015-12-23 ENCOUNTER — Inpatient Hospital Stay: Admit: 2015-12-23 | Payer: Medicare HMO

## 2015-12-23 ENCOUNTER — Encounter
Admission: EM | Disposition: A | Discharge status: Home / Self Care | Payer: Self-pay | Source: Home / Self Care | Attending: Internal Medicine

## 2015-12-23 ENCOUNTER — Encounter: Payer: Self-pay | Admitting: Internal Medicine

## 2015-12-23 DIAGNOSIS — M779 Enthesopathy, unspecified: Secondary | ICD-10-CM | POA: Diagnosis present

## 2015-12-23 HISTORY — PX: CARDIAC CATHETERIZATION: SHX172

## 2015-12-23 LAB — BASIC METABOLIC PANEL
ANION GAP: 4 — AB (ref 5–15)
BUN: 14 mg/dL (ref 6–20)
CHLORIDE: 112 mmol/L — AB (ref 101–111)
CO2: 22 mmol/L (ref 22–32)
Calcium: 8.6 mg/dL — ABNORMAL LOW (ref 8.9–10.3)
Creatinine, Ser: 0.87 mg/dL (ref 0.61–1.24)
GFR calc Af Amer: >60 mL/min (ref >60)
GFR calc non Af Amer: >60 mL/min (ref >60)
Glucose, Bld: 121 mg/dL — ABNORMAL HIGH (ref 65–99)
POTASSIUM: 3.7 mmol/L (ref 3.5–5.1)
SODIUM: 138 mmol/L (ref 135–145)

## 2015-12-23 LAB — CBC
HEMATOCRIT: 40.2 % (ref 40.0–52.0)
HEMOGLOBIN: 12.6 g/dL — AB (ref 13.0–18.0)
MCH: 22.7 pg — AB (ref 26.0–34.0)
MCHC: 31.3 g/dL — AB (ref 32.0–36.0)
MCV: 72.3 fL — ABNORMAL LOW (ref 80.0–100.0)
Platelets: 88 10*3/uL — ABNORMAL LOW (ref 150–440)
RBC: 5.56 MIL/uL (ref 4.40–5.90)
RDW: 16 % — ABNORMAL HIGH (ref 11.5–14.5)
WBC: 5.9 10*3/uL (ref 3.8–10.6)

## 2015-12-23 LAB — TROPONIN I: TROPONIN I: 0.13 ng/mL — AB (ref <0.031)

## 2015-12-23 LAB — HEMOGLOBIN A1C: Hgb A1c MFr Bld: 6 % (ref 4.0–6.0)

## 2015-12-23 LAB — HEPARIN LEVEL (UNFRACTIONATED): HEPARIN UNFRACTIONATED: 0.59 [IU]/mL (ref 0.30–0.70)

## 2015-12-23 SURGERY — LEFT HEART CATH AND CORONARY ANGIOGRAPHY
Anesthesia: Moderate Sedation

## 2015-12-23 MED ORDER — MIDAZOLAM HCL 2 MG/2ML IJ SOLN
INTRAMUSCULAR | Status: DC | PRN
  Administered 2015-12-23: 1 mg via INTRAVENOUS

## 2015-12-23 MED ORDER — SODIUM CHLORIDE 0.9 % IV SOLN: 250.0000 mL | INTRAVENOUS | Status: DC | PRN

## 2015-12-23 MED ORDER — ACETAMINOPHEN 325 MG PO TABS: 650.0000 mg | ORAL_TABLET | ORAL | Status: DC | PRN

## 2015-12-23 MED ORDER — DICLOFENAC SODIUM 1 % TD GEL
2.0000 g | Freq: Four times a day (QID) | TRANSDERMAL | Status: DC
  Filled 2015-12-23: qty 100

## 2015-12-23 MED ORDER — IOHEXOL 300 MG/ML  SOLN
INTRAMUSCULAR | Status: DC | PRN
  Administered 2015-12-23: 155 mL via INTRA_ARTERIAL

## 2015-12-23 MED ORDER — MIDAZOLAM HCL 2 MG/2ML IJ SOLN
INTRAMUSCULAR | Status: AC
Start: 2015-12-23 — End: 2015-12-23
  Filled 2015-12-23: qty 2

## 2015-12-23 MED ORDER — SODIUM CHLORIDE 0.9% FLUSH: 3.0000 mL | Freq: Two times a day (BID) | INTRAVENOUS | Status: DC

## 2015-12-23 MED ORDER — ONDANSETRON HCL 4 MG/2ML IJ SOLN: 4.0000 mg | Freq: Four times a day (QID) | INTRAMUSCULAR | Status: DC | PRN

## 2015-12-23 MED ORDER — SODIUM CHLORIDE 0.9% FLUSH: 3.0000 mL | INTRAVENOUS | Status: DC | PRN

## 2015-12-23 MED ORDER — SODIUM CHLORIDE 0.9% FLUSH
3.0000 mL | Freq: Two times a day (BID) | INTRAVENOUS | Status: DC
  Administered 2015-12-23: 3 mL via INTRAVENOUS

## 2015-12-23 MED ORDER — FENTANYL CITRATE (PF) 100 MCG/2ML IJ SOLN
INTRAMUSCULAR | Status: AC
  Filled 2015-12-23: qty 2

## 2015-12-23 MED ORDER — FENTANYL CITRATE (PF) 100 MCG/2ML IJ SOLN
INTRAMUSCULAR | Status: DC | PRN
  Administered 2015-12-23: 25 ug via INTRAVENOUS

## 2015-12-23 MED ORDER — ASPIRIN 81 MG PO CHEW: 81.0000 mg | CHEWABLE_TABLET | ORAL | Status: DC

## 2015-12-23 MED ORDER — HEPARIN (PORCINE) IN NACL 2-0.9 UNIT/ML-% IJ SOLN
INTRAMUSCULAR | Status: AC
  Filled 2015-12-23: qty 1000

## 2015-12-23 MED ORDER — SODIUM CHLORIDE 0.9 % WEIGHT BASED INFUSION: 1.0000 mL/kg/h | INTRAVENOUS | Status: DC

## 2015-12-23 MED ORDER — SODIUM CHLORIDE 0.9 % WEIGHT BASED INFUSION: 3.0000 mL/kg/h | INTRAVENOUS | Status: DC

## 2015-12-23 SURGICAL SUPPLY — 10 items
CATH INFINITI 5FR ANG PIGTAIL (CATHETERS) ×3 IMPLANT
CATH INFINITI 5FR JL4 (CATHETERS) ×3 IMPLANT
CATH INFINITI 5FR JL5 (CATHETERS) ×3 IMPLANT
CATH INFINITI JR4 5F (CATHETERS) ×3 IMPLANT
DEVICE CLOSURE MYNXGRIP 5F (Vascular Products) ×3 IMPLANT
KIT MANI 3VAL PERCEP (MISCELLANEOUS) ×3 IMPLANT
NEEDLE PERC 18GX7CM (NEEDLE) ×3 IMPLANT
PACK CARDIAC CATH (CUSTOM PROCEDURE TRAY) ×3 IMPLANT
SHEATH PINNACLE 5F 10CM (SHEATH) ×3 IMPLANT
WIRE EMERALD 3MM-J .035X150CM (WIRE) ×3 IMPLANT

## 2015-12-23 NOTE — Progress Notes (Signed)
[facility] -  at [facility]   PATIENT NAME: Derek Bernard    MR#:  [id number]  DATE OF BIRTH:  04/17/1930  SUBJECTIVE:  CHIEF COMPLAINT:   Chief Complaint  Patient presents with  . Arm Pain   - No chest pain, troponin elevated. Still has left elbow pain - for cardiac cath this afternoon  REVIEW OF SYSTEMS:  Review of Systems  Constitutional: Negative for fever and chills.  HENT: Negative for ear discharge, ear pain and nosebleeds.   Eyes: Negative for blurred vision and double vision.  Respiratory: Negative for cough, shortness of breath and wheezing.   Cardiovascular: Negative for chest pain, palpitations, claudication and leg swelling.  Gastrointestinal: Negative for nausea, vomiting, abdominal pain, diarrhea and constipation.  Genitourinary: Negative for dysuria.  Musculoskeletal: Positive for myalgias and joint pain. Negative for back pain.       Left elbow pain on lateral side  Neurological: Negative for dizziness, sensory change, speech change, focal weakness, seizures, weakness and headaches.  Psychiatric/Behavioral: Negative for depression.    DRUG ALLERGIES:  No Known Allergies  VITALS:  Blood pressure 139/71, pulse 60, temperature 98.7 F (37.1 C), temperature source Oral, resp. rate 20, height  (1.727 m), weight 77.701 kg (171 lb 4.8 oz), SpO2 100 %.  PHYSICAL EXAMINATION:  Physical Exam  GENERAL: 82 y.o.-year-old patient lying in the bed with no acute distress.  EYES: Pupils equal, round, reactive to light and accommodation. No scleral icterus. Extraocular muscles intact.  HEENT: Head atraumatic, normocephalic. Oropharynx and nasopharynx clear.  NECK: Supple, no jugular venous distention. No thyroid enlargement, no tenderness.  LUNGS: Normal breath sounds bilaterally, no wheezing, rales,rhonchi or crepitation. No use of accessory muscles of respiration.  CARDIOVASCULAR: S1, S2 normal. No murmurs, rubs, or  gallops. Pacemaker in place on left chest.  ABDOMEN: Soft, nontender, nondistended. Bowel sounds present. No organomegaly or mass.  EXTREMITIES: No pedal edema, cyanosis, or clubbing. On pronation of left arm, some pain at the lateral side NEUROLOGIC: Cranial nerves II through XII are intact. Muscle strength 5/5 in all extremities. Sensation intact. Gait not checked.  PSYCHIATRIC: The patient is alert and oriented x 3.  SKIN: No obvious rash, lesion, or ulcer.    LABORATORY PANEL:   CBC  Recent Labs Lab 12/23/15 0559  WBC 5.9  HGB 12.6*  HCT 40.2  PLT 88*   ------------------------------------------------------------------------------------------------------------------  Chemistries   Recent Labs Lab 12/23/15 0559  NA 138  K 3.7  CL 112*  CO2 22  GLUCOSE 121*  BUN 14  CREATININE 0.87  CALCIUM 8.6*   ------------------------------------------------------------------------------------------------------------------  Cardiac Enzymes  Recent Labs Lab 12/23/15 0148  TROPONINI 0.13*   ------------------------------------------------------------------------------------------------------------------  RADIOLOGY:  Dg Chest 2 View  12/22/2015  CLINICAL DATA:  Left arm pain beginning yesterday. Diaphoresis. No known injury. Initial encounter. EXAM: CHEST  2 VIEW COMPARISON:  CT chest and PA and lateral chest 02/07/2012. FINDINGS: Pacing device is in place, unchanged. Heart size is normal. Pulmonary fibrosis appearing worst in the lung bases has progressed since the prior study. No consolidative process, pneumothorax or effusion. IMPRESSION: Progressed pulmonary fibrosis.  No acute disease. Electronically Signed   By: Drusilla Kanner M.D.   On: 12/22/2015 16:44    EKG:   Orders placed or performed during the hospital encounter of 12/22/15  . EKG 12-Lead  . EKG 12-Lead  . ED EKG within 10 minutes  . ED EKG within 10 minutes    ASSESSMENT AND PLAN:  Derek DaltonWilliam  Bernard is a 82 y.o. male with a known history of retention, anemia, BPH, pulmonary fibrosis not on any home oxygen presents to the hospital secondary to diaphoresis and also left arm pain.  #1 NSTEMI- atypical presentation. First troponin is elevated. -Appreciate cardiology consult.  on IV heparin drip. -Troponins are elevated but plateaued. For cardiac catheterization today -On aspirin, and statin  #2 glaucoma-continue all his eyedrops  #3 pulmonary fibrosis-follows with pulmonology as outpatient. Stable and not on home oxygen. -Continue his home medications.  #4 DVT prophylaxis-on IV heparin  #5 musculoskeletal left elbow pain-appears more like a tennis elbow. Ice and NSAIDS as needed if cardiac catheterization is negative.     All the records are reviewed and case discussed with Care Management/Social Workerr. Management plans discussed with the patient, family and they are in agreement.  CODE STATUS: Full Code  TOTAL TIME TAKING CARE OF THIS PATIENT: 37 minutes.   POSSIBLE D/C IN 1 DAYS, DEPENDING ON CLINICAL CONDITION.   Enid BaasKALISETTI,Kayn Haymore M.D on 12/23/2015 at 11:02 AM  Between 7am to 6pm - Pager - [phone]  After 6pm go to [email or web address] - password [email or web address] [facility]  [facility]  Office  [phone]  CC: Primary care physician; No primary care provider on file.

## 2015-12-23 NOTE — Progress Notes (Signed)
Pt clinically stable post heart cath, Dr Gwen PoundsKowalski out to speak with patient, right groin mynx closed, no bleeding nor hematoma at site, vss, eating lunch, may be for discharge later today, [facility] on 2a with plan reviewed.

## 2015-12-23 NOTE — Consult Note (Signed)
[facility] Card[facility] Consultation Note  Patient ID: Derek Bernard, MRN: [id number], DOB/AGE: 05-31-1930 82 y.o. Admit date: 12/22/2015   Date of Consult: 12/23/2015 Primary Physician: No primary care provider on file. Primary Cardiologist: F ATH  Chief Complaint:  Chief Complaint  Patient presents with  . Arm Pain   Reason for Consult: non-ST elevation myocardial infarction  HPI: 82 y.o. male with known pulmonary fibrosis and bradycardia status post pacemaker placement with essential hypertension and mixed hyperlipidemia having waxing and waning left upper chest discomfort and arm discomfort over the last several days to the significant that issue of having severe pain relieved by nitroglycerin as well as heparin. The patient has full relief at this time. Patient has an EKG showing normal sinus rhythm paced atrial rhythm with nonspecific lateral and anterior precordial ST changes. Troponin is elevated at 0.14 consistent with the non-ST elevation myocardial infarction. The patient has been on appropriate medication management since admission and now is feeling much better.  Past Medical History  Diagnosis Date  . Hypertension   . Anemia   . BPH (benign prostatic hyperplasia)   . CKD (chronic kidney disease)   . Pulmonary fibrosis (HCC)   . Sinus bradycardia   . Sick sinus syndrome [facility])     s/p pacemaker  . Gout       Surgical History:  Past Surgical History  Procedure Laterality Date  . Pacemaker insertion    . Cataract extraction      Right eye     Home Meds: Prior to Admission medications   Medication Sig Start Date End Date Taking? Authorizing Provider  aspirin EC 81 MG tablet Take 81 mg by mouth daily.   Yes Historical Provider, MD  brimonidine (ALPHAGAN P) 0.1 % SOLN Place 1 drop into both eyes 3 (three) times daily.   Yes Historical Provider, MD  budesonide-formoterol (SYMBICORT) 160-4.5 MCG/ACT inhaler Inhale 2 puffs into the lungs 2 (two) times daily.   Yes  Historical Provider, MD  celecoxib (CELEBREX) 200 MG capsule Take 200 mg by mouth daily.   Yes Historical Provider, MD  diclofenac sodium (VOLTAREN) 1 % GEL Apply 2 g topically 4 (four) times daily as needed (for pain).   Yes Historical Provider, MD  dorzolamide-timolol (COSOPT) 22.3-6.8 MG/ML ophthalmic solution Place 1 drop into both eyes 2 (two) times daily.   Yes Historical Provider, MD  fexofenadine (ALLEGRA) 180 MG tablet Take 180 mg by mouth daily.   Yes Historical Provider, MD  fluticasone (FLONASE) 50 MCG/ACT nasal spray Place 2 sprays into both nostrils daily.   Yes Historical Provider, MD  furosemide (LASIX) 20 MG tablet Take 20 mg by mouth daily.   Yes Historical Provider, MD  latanoprost (XALATAN) 0.005 % ophthalmic solution Place 1 drop into both eyes at bedtime.   Yes Historical Provider, MD  montelukast (SINGULAIR) 10 MG tablet Take 10 mg by mouth daily.   Yes Historical Provider, MD  omeprazole (PRILOSEC) 20 MG capsule Take 40 mg by mouth daily before breakfast.   Yes Historical Provider, MD  potassium chloride (K-DUR) 10 MEQ tablet Take 10 mEq by mouth daily.   Yes Historical Provider, MD  pravastatin (PRAVACHOL) 40 MG tablet Take 40 mg by mouth at bedtime.   Yes Historical Provider, MD  zolpidem (AMBIEN) 5 MG tablet Take 5 mg by mouth at bedtime as needed for sleep.   Yes Historical Provider, MD    Inpatient Medications:  . aspirin EC  81 mg Oral Daily  .  brimonidine  1 drop Both Eyes TID  . dorzolamide-timolol  1 drop Both Eyes BID  . fluticasone  2 spray Each Nare Daily  . latanoprost  1 drop Both Eyes QHS  . loratadine  10 mg Oral Daily  . mometasone-formoterol  2 puff Inhalation BID  . montelukast  10 mg Oral Daily  . pantoprazole  40 mg Oral Daily  . pravastatin  40 mg Oral QHS  . sodium chloride flush  3 mL Intravenous Q12H   . sodium chloride 60 mL/hr at 12/22/15 2242  . heparin 950 Units/hr (12/22/15 2242)    Allergies: No Known Allergies  Social History    Social History  . Marital Status: Widowed    Spouse Name: N/A  . Number of Children: N/A  . Years of Education: N/A   Occupational History  . Not on file.   Social History Main Topics  . Smoking status: Former Games developer  . Smokeless tobacco: Not on file     Comment: Quit 20 years ago  . Alcohol Use: No  . Drug Use: No  . Sexual Activity: Not on file   Other Topics Concern  . Not on file   Social History Narrative   Independent, lives at home by himself.     No family history on file.   Review of Systems Positive for Chest and arm pain Negative for: General:  chills, fever, night sweats or weight changes.  Cardiovascular: PND orthopnea syncope dizziness  Dermatological skin lesions rashes Respiratory: Cough congestion Urologic: Frequent urination urination at night and hematuria Abdominal: negative for nausea, vomiting, diarrhea, bright red blood per rectum, melena, or hematemesis Neurologic: negative for visual changes, and/or hearing changes  All other systems reviewed and are otherwise negative except as noted above.  Labs:  Recent Labs  12/22/15 1603 12/22/15 2129 12/23/15 0148  TROPONINI 0.13* 0.14* 0.13*   Lab Results  Component Value Date   WBC 5.9 12/23/2015   HGB 12.6* 12/23/2015   HCT 40.2 12/23/2015   MCV 72.3* 12/23/2015   PLT 88* 12/23/2015    Recent Labs Lab 12/23/15 0559  NA 138  K 3.7  CL 112*  CO2 22  BUN 14  CREATININE 0.87  CALCIUM 8.6*  GLUCOSE 121*   Lab Results  Component Value Date   CHOL 180 12/22/2015   HDL 81 12/22/2015   LDLCALC 92 12/22/2015   TRIG 35 12/22/2015   No results found for: DDIMER  Radiology/Studies:  Dg Chest 2 View  12/22/2015  CLINICAL DATA:  Left arm pain beginning yesterday. Diaphoresis. No known injury. Initial encounter. EXAM: CHEST  2 VIEW COMPARISON:  CT chest and PA and lateral chest 02/07/2012. FINDINGS: Pacing device is in place, unchanged. Heart size is normal. Pulmonary fibrosis  appearing worst in the lung bases has progressed since the prior study. No consolidative process, pneumothorax or effusion. IMPRESSION: Progressed pulmonary fibrosis.  No acute disease. Electronically Signed   By: Drusilla Kanner M.D.   On: 12/22/2015 16:44    EKG: Paced atrial rhythm with ventricular sponsor and nonspecific anterior precordial ST changes  Weights: Filed Weights   12/22/15 1601  Weight: 173 lb (78.472 kg)     Physical Exam: Blood pressure 139/71, pulse 60, temperature 98.7 F (37.1 C), temperature source Oral, resp. rate 20, height  (1.727 m), weight 173 lb (78.472 kg), SpO2 100 %. Body mass index is 26.31 kg/(m^2). General: Well developed, well nourished, in no acute distress. Head eyes ears nose throat: Normocephalic,  atraumatic, sclera non-icteric, no xanthomas, nares are without discharge. No apparent thyromegaly and/or mass  Lungs: Normal respiratory effort.  Some wheezes, Few rales, no rhonchi.  Heart: RRR with normal S1 S2. no murmur gallop, no rub, PMI is normal size and placement, carotid upstroke normal without bruit, jugular venous pressure is normal Abdomen: Soft, non-tender, non-distended with normoactive bowel sounds. No hepatomegaly. No rebound/guarding. No obvious abdominal masses. Abdominal aorta is normal size without bruit Extremities: No edema. no cyanosis, no clubbing, no ulcers  Peripheral : 2+ bilateral upper extremity pulses, 2+ bilateral femoral pulses, 2+ bilateral dorsal pedal pulse Neuro: Alert and oriented. No facial asymmetry. No focal deficit. Moves all extremities spontaneously. Musculoskeletal: Normal muscle tone without kyphosis Psych:  Responds to questions appropriately with a normal affect.    Assessment: 82 year old male with essential hypertension bradycardia status post pacemaker placement mixed hyperlipidemia pulmonary fibrosis with acute non-ST elevation myocardial infarction  Plan: 1. Continue heparin for further risk  reduction of cardiovascular disease and myocardial infarction 2. Possible use of beta blocker if able 3. Proceed to cardiac catheterization to assess coronary anatomy and further treatment thereof is necessary. Patient understands risk and benefits of cardiac catheterization. This includes a possibility of death stroke heart attack infection bleeding or blood clot. He is at low risk for conscious sedation  Signed, Lamar BlinksKOWALSKI,Raigan Baria J M.D. [facility] 12/23/2015, 8:30 AM

## 2015-12-23 NOTE — Progress Notes (Signed)
[facility] Cardiology [facility] Encounter Note  Patient: Derek Bernard / Admit Date: 12/22/2015 / Date of Encounter: 12/23/2015, 1:20 PM   Subjective: No more chest or arm pain. Patient hemodynamically stable. Troponin level unchanged.  Review of Systems: Positive for: Arm pain Negative for: Vision change, hearing change, syncope, dizziness, nausea, vomiting,diarrhea, bloody stool, stomach pain, cough, congestion, diaphoresis, urinary frequency, urinary pain,skin lesions, skin rashes Others previously listed  Objective: Telemetry: Atrial pacing with ventricular rhythm Physical Exam: Blood pressure 155/74, pulse 61, temperature 98.3 F (36.8 C), temperature source Oral, resp. rate 19, height  (1.727 m), weight 171 lb 4.8 oz (77.701 kg), SpO2 98 %. Body mass index is 26.05 kg/(m^2). General: Well developed, well nourished, in no acute distress. Head: Normocephalic, atraumatic, sclera non-icteric, no xanthomas, nares are without discharge. Neck: No apparent masses Lungs: Normal respirations with no wheezes, no rhonchi, no rales , no crackles   Heart: Regular rate and rhythm, normal S1 S2, no murmur, no rub, no gallop, PMI is normal size and placement, carotid upstroke normal without bruit, jugular venous pressure normal Abdomen: Soft, non-tender, non-distended with normoactive bowel sounds. No hepatosplenomegaly. Abdominal aorta is normal size without bruit Extremities: No edema, no clubbing, no cyanosis, no ulcers,  Peripheral: 2+ radial, 2+ femoral, 2+ dorsal pedal pulses Neuro: Alert and oriented. Moves all extremities spontaneously. Psych:  Responds to questions appropriately with a normal affect.   Intake/Output Summary (Last 24 hours) at 12/23/15 1320 Last data filed at 12/23/15 0700  Gross per 24 hour  Intake 576.85 ml  Output    500 ml  Net  76.85 ml    Inpatient Medications:  . [START ON 12/24/2015] aspirin  81 mg Oral Pre-Cath  . [MAR Hold] aspirin EC  81 mg  Oral Daily  . [MAR Hold] brimonidine  1 drop Both Eyes TID  . [MAR Hold] dorzolamide-timolol  1 drop Both Eyes BID  . [MAR Hold] fluticasone  2 spray Each Nare Daily  . [MAR Hold] latanoprost  1 drop Both Eyes QHS  . [MAR Hold] loratadine  10 mg Oral Daily  . [MAR Hold] mometasone-formoterol  2 puff Inhalation BID  . [MAR Hold] montelukast  10 mg Oral Daily  . [MAR Hold] pantoprazole  40 mg Oral Daily  . [MAR Hold] pravastatin  40 mg Oral QHS  . [MAR Hold] sodium chloride flush  3 mL Intravenous Q12H  . sodium chloride flush  3 mL Intravenous Q12H   Infusions:  . sodium chloride 60 mL/hr at 12/22/15 2242  . [START ON 12/24/2015] sodium chloride     Followed by  . [START ON 12/24/2015] sodium chloride    . heparin 950 Units/hr (12/22/15 2242)    Labs:  Recent Labs  12/22/15 1603 12/23/15 0559  NA 137 138  K 3.7 3.7  CL 110 112*  CO2 22 22  GLUCOSE 110* 121*  BUN 19 14  CREATININE 0.98 0.87  CALCIUM 9.0 8.6*   No results for input(s): AST, ALT, ALKPHOS, BILITOT, PROT, ALBUMIN in the last 72 hours.  Recent Labs  12/22/15 1603 12/23/15 0559  WBC 6.2 5.9  HGB 12.7* 12.6*  HCT 40.0 40.2  MCV 71.8* 72.3*  PLT 99* 88*    Recent Labs  12/22/15 1603 12/22/15 2129 12/23/15 0148  TROPONINI 0.13* 0.14* 0.13*   Invalid input(s): POCBNP  Recent Labs  12/22/15 1603  HGBA1C 6.0     Weights: Filed Weights   12/22/15 1601 12/23/15 0839  Weight: 173 lb (  78.472 kg) 171 lb 4.8 oz (77.701 kg)     Radiology/Studies:  Dg Chest 2 View  12/22/2015  CLINICAL DATA:  Left arm pain beginning yesterday. Diaphoresis. No known injury. Initial encounter. EXAM: CHEST  2 VIEW COMPARISON:  CT chest and PA and lateral chest 02/07/2012. FINDINGS: Pacing device is in place, unchanged. Heart size is normal. Pulmonary fibrosis appearing worst in the lung bases has progressed since the prior study. No consolidative process, pneumothorax or effusion. IMPRESSION: Progressed pulmonary  fibrosis.  No acute disease. Electronically Signed   By: Drusilla Kannerhomas  Dalessio M.D.   On: 12/22/2015 16:44     Assessment and Recommendation  82 y.o. male with known history of bradycardia status post pacemaker placement essential hypertension makes hyperlipidemia with the left arm and chest pain consistent with unstable angina with elevated troponin Cardiac catheterization showing normal LV systolic function without evidence of myocardial infarction and ejection fraction of 60%. There is minimal athero-sclerosis of 3 vessels but no evidence of significant stenosis requiring further intervention 1. No further cardiac intervention at this time due to no evidence of acute coronary syndrome or myocardial infarction with minimal coronary atherosclerosis by cardiac catheterization 2. Continue risk factor modification with hypertension control and lipid control as before 3. In ambulation and follow for any further significant symptoms and okay for discharge to home with follow-up in 2 weeks  Signed, Arnoldo HookerBruce Sommer Spickard M.D. FACC

## 2015-12-23 NOTE — Progress Notes (Signed)
ANTICOAGULATION CONSULT NOTE - Initial Consult  Pharmacy Consult for heparin drip Indication: chest pain/ACS  No Known Allergies  Patient Measurements: Height: 5\' 8"  (172.7 cm) Weight: 173 lb (78.472 kg) IBW/kg (Calculated) : 68.4 Heparin Dosing Weight: 78.5 kg  Vital Signs: Temp: 98.7 F (37.1 C) (03/22 0346) Temp Source: Oral (03/22 0346) BP: 139/71 mmHg (03/22 0346) Pulse Rate: 60 (03/22 0346)  Labs:  Recent Labs  12/22/15 1603 12/22/15 2129 12/23/15 0148 12/23/15 0559  HGB 12.7*  --   --  12.6*  HCT 40.0  --   --  40.2  PLT 99*  --   --  88*  APTT  --  29  --   --   LABPROT  --  13.9  --   --   INR  --  1.05  --   --   HEPARINUNFRC  --   --   --  0.59  CREATININE 0.98  --   --  0.87  TROPONINI 0.13* 0.14* 0.13*  --     Estimated Creatinine Clearance: 62.2 mL/min (by C-G formula based on Cr of 0.87).   Medical History: Past Medical History  Diagnosis Date  . Hypertension   . Anemia   . BPH (benign prostatic hyperplasia)   . CKD (chronic kidney disease)   . Pulmonary fibrosis (HCC)   . Sinus bradycardia   . Sick sinus syndrome [facility](HCC)     s/p pacemaker  . Gout     Assessment: Pharmacy consulted to dose and monitor heparin drip in this 82 year old male for ACS/NSTEMI protocol. Patient was not taking anticoagulants prior to admission. Baseline labs ordered.  Hgb: 12.7 Plt 99 Troponin 0.13  APTT and INR ordered  Goal of Therapy:  Heparin level 0.3-0.7 units/ml Monitor platelets by anticoagulation protocol: Yes   Plan:  Give 3000 units bolus x 1 Start heparin infusion at 950 units/hr Check anti-Xa level in 8 hours and daily while on heparin Continue to monitor H&H and platelets   3/22 AM heparin level 0.59. Recheck in 8 hours to confirm.  Kaytlen Lightsey S, PharmD 12/23/2015,7:14 AM

## 2015-12-23 NOTE — Progress Notes (Signed)
Spoke with Dr. Nemiah CommanderKalisetti about discharge. MD stated okay to discharge after patient walked and is stable. No need to continue fluids per MD. Discharge packet given to patient. No new medications. Education on chest pain given. Son and daughter-in-law were here earlier, but had to leave. Stated to call them when patient is ready, they have been called and will be on the way. IV and tele removed.

## 2015-12-23 NOTE — Discharge Summary (Signed)
[facility] [place] at [facility]   PATIENT NAME: Derek Bernard    MR#:  [id number]  DATE OF BIRTH:  1930/01/09  DATE OF ADMISSION:  12/22/2015 ADMITTING PHYSICIAN: Enid Baas, MD  DATE OF DISCHARGE: 12/23/15  PRIMARY CARE PHYSICIAN: No primary care provider on file.    ADMISSION DIAGNOSIS:  ACS (acute coronary syndrome) (HCC) [I24.9] Non-ST elevation (NSTEMI) myocardial infarction (HCC) [I21.4] Pain of left upper extremity [M79.602]  DISCHARGE DIAGNOSIS:  Principal Problem:   Tendinitis   SECONDARY DIAGNOSIS:   Past Medical History  Diagnosis Date  . Hypertension   . Anemia   . BPH (benign prostatic hyperplasia)   . CKD (chronic kidney disease)   . Pulmonary fibrosis (HCC)   . Sinus bradycardia   . Sick sinus syndrome [facility])     s/p pacemaker  . Gout     HOSPITAL COURSE:   [name] is a 82 y.o. male with a known history of retention, anemia, BPH, pulmonary fibrosis not on any home oxygen presents to the hospital secondary to diaphoresis and also left arm pain.  #1 Elevated troponin-  atypical presentation with elevated troponin.-Troponins are elevated but plateaued.  - patient had cardiac catheterization which showed only mild coronary atherosclerosis about 25% occlusions, no significant disease - so troponin elevated could be demand ischemia - Arm pain is more musculoskeletal -On aspirin, and statin anyway  #2 glaucoma-continue all his eyedrops  #3 pulmonary fibrosis-follows with pulmonology as outpatient. Stable and not on home oxygen. -Continue his home medications.  #4 GERD- on PPI  #5 musculoskeletal left elbow pain-appears more like a tendinitis. Ice and NSAIDS - voltaren gel QID prn and PCP f/u in 1 week  Patient will be discharged home today  DISCHARGE CONDITIONS:   Stable  CONSULTS OBTAINED:  Treatment Team:  Lamar Blinks, MD  DRUG ALLERGIES:  No Known Allergies  DISCHARGE MEDICATIONS:    Current Discharge Medication List    CONTINUE these medications which have NOT CHANGED   Details  aspirin EC 81 MG tablet Take 81 mg by mouth daily.    brimonidine (ALPHAGAN P) 0.1 % SOLN Place 1 drop into both eyes 3 (three) times daily.    budesonide-formoterol (SYMBICORT) 160-4.5 MCG/ACT inhaler Inhale 2 puffs into the lungs 2 (two) times daily.    celecoxib (CELEBREX) 200 MG capsule Take 200 mg by mouth daily.    diclofenac sodium (VOLTAREN) 1 % GEL Apply 2 g topically 4 (four) times daily as needed (for pain).    dorzolamide-timolol (COSOPT) 22.3-6.8 MG/ML ophthalmic solution Place 1 drop into both eyes 2 (two) times daily.    fexofenadine (ALLEGRA) 180 MG tablet Take 180 mg by mouth daily.    fluticasone (FLONASE) 50 MCG/ACT nasal spray Place 2 sprays into both nostrils daily.    furosemide (LASIX) 20 MG tablet Take 20 mg by mouth daily.    latanoprost (XALATAN) 0.005 % ophthalmic solution Place 1 drop into both eyes at bedtime.    montelukast (SINGULAIR) 10 MG tablet Take 10 mg by mouth daily.    omeprazole (PRILOSEC) 20 MG capsule Take 40 mg by mouth daily before breakfast.    potassium chloride (K-DUR) 10 MEQ tablet Take 10 mEq by mouth daily.    pravastatin (PRAVACHOL) 40 MG tablet Take 40 mg by mouth at bedtime.    zolpidem (AMBIEN) 5 MG tablet Take 5 mg by mouth at bedtime as needed for sleep.         DISCHARGE INSTRUCTIONS:  1. PCP f/u in 1-2 weeks  If you experience worsening of your admission symptoms, develop shortness of breath, life threatening emergency, suicidal or homicidal thoughts you must seek medical attention immediately by calling 911 or calling your MD immediately  if symptoms less severe.  You Must read complete instructions/literature along with all the possible adverse reactions/side effects for all the Medicines you take and that have been prescribed to you. Take any new Medicines after you have completely understood and accept all  the possible adverse reactions/side effects.   Please note  You were cared for by a hospitalist during your hospital stay. If you have any questions about your discharge medications or the care you received while you were in the hospital after you are discharged, you can call the unit and asked to speak with the hospitalist on call if the hospitalist that took care of you is not available. Once you are discharged, your primary care physician will handle any further medical issues. Please note that NO REFILLS for any discharge medications will be authorized once you are discharged, as it is imperative that you return to your primary care physician (or establish a relationship with a primary care physician if you do not have one) for your aftercare needs so that they can reassess your need for medications and monitor your lab values.    Today   CHIEF COMPLAINT:   Chief Complaint  Patient presents with  . Arm Pain    VITAL SIGNS:  Blood pressure 128/60, pulse 62, temperature 98.3 F (36.8 C), temperature source Oral, resp. rate 14, height  (1.727 m), weight 77.701 kg (171 lb 4.8 oz), SpO2 100 %.  I/O:   Intake/Output Summary (Last 24 hours) at 12/23/15 1647 Last data filed at 12/23/15 0700  Gross per 24 hour  Intake 576.85 ml  Output    500 ml  Net  76.85 ml    PHYSICAL EXAMINATION:   Physical Exam  GENERAL: 82 y.o.-year-old patient lying in the bed with no acute distress.  EYES: Pupils equal, round, reactive to light and accommodation. No scleral icterus. Extraocular muscles intact.  HEENT: Head atraumatic, normocephalic. Oropharynx and nasopharynx clear.  NECK: Supple, no jugular venous distention. No thyroid enlargement, no tenderness.  LUNGS: Normal breath sounds bilaterally, no wheezing, rales,rhonchi or crepitation. No use of accessory muscles of respiration.  CARDIOVASCULAR: S1, S2 normal. No murmurs, rubs, or gallops. Pacemaker in place on left chest.   ABDOMEN: Soft, nontender, nondistended. Bowel sounds present. No organomegaly or mass.  EXTREMITIES: No pedal edema, cyanosis, or clubbing. On pronation of left arm, some pain at the lateral side NEUROLOGIC: Cranial nerves II through XII are intact. Muscle strength 5/5 in all extremities. Sensation intact. Gait not checked.  PSYCHIATRIC: The patient is alert and oriented x 3.  SKIN: No obvious rash, lesion, or ulcer.   DATA REVIEW:   CBC  Recent Labs Lab 12/23/15 0559  WBC 5.9  HGB 12.6*  HCT 40.2  PLT 88*    Chemistries   Recent Labs Lab 12/23/15 0559  NA 138  K 3.7  CL 112*  CO2 22  GLUCOSE 121*  BUN 14  CREATININE 0.87  CALCIUM 8.6*    Cardiac Enzymes  Recent Labs Lab 12/23/15 0148  TROPONINI 0.13*    Microbiology Results  Results for orders placed or performed in visit on 02/07/12  Culture, blood (single)     Status: None   Collection Time: 02/07/12  3:41 PM  Result Value Ref  Range Status   Micro Text Report   Final       COMMENT                   NO GROWTH AEROBICALLY/ANAEROBICALLY IN 5 DAYS   ANTIBIOTIC                                                      Culture, blood (single)     Status: None   Collection Time: 02/07/12  3:41 PM  Result Value Ref Range Status   Micro Text Report   Final       COMMENT                   NO GROWTH AEROBICALLY/ANAEROBICALLY IN 5 DAYS   ANTIBIOTIC                                                        RADIOLOGY:  Dg Chest 2 View  12/22/2015  CLINICAL DATA:  Left arm pain beginning yesterday. Diaphoresis. No known injury. Initial encounter. EXAM: CHEST  2 VIEW COMPARISON:  CT chest and PA and lateral chest 02/07/2012. FINDINGS: Pacing device is in place, unchanged. Heart size is normal. Pulmonary fibrosis appearing worst in the lung bases has progressed since the prior study. No consolidative process, pneumothorax or effusion. IMPRESSION: Progressed pulmonary fibrosis.  No acute disease. Electronically  Signed   By: Drusilla Kannerhomas  Dalessio M.D.   On: 12/22/2015 16:44    EKG:   Orders placed or performed during the hospital encounter of 12/22/15  . EKG 12-Lead  . EKG 12-Lead  . ED EKG within 10 minutes  . ED EKG within 10 minutes      Management plans discussed with the patient, family and they are in agreement.  CODE STATUS:     Code Status Orders        Start     Ordered   12/22/15 2034  Full code   Continuous     12/22/15 2033    Code Status History    Date Active Date Inactive Code Status Order ID Comments User Context   This patient has a current code status but no historical code status.      TOTAL TIME TAKING CARE OF THIS PATIENT: 37  minutes.    Enid BaasKALISETTI,Karynn Deblasi M.D on 12/23/2015 at 4:47 PM  Between 7am to 6pm - Pager - [phone]  After 6pm go to [email or web address] - password [email or web address]  [facility]  Office  [phone]  CC: Primary care physician; No primary care provider on file.

## 2015-12-23 NOTE — Progress Notes (Signed)
Patient has been prepped for cath today. Weighed and groins clipped. Fluids currently running at 60mL per previous order. Consent has been signed and patient has been educated. Patient has had his aspirin this AM and has had clear liquids. Per Dr. Joana ReamerKowolski cath will be around 12:00.

## 2015-12-23 NOTE — Care Management (Signed)
Presented from home with left arm pain.  Found to have mildly elevated troponins.  For cardiac cath this afternoon.  Presents from home.  No concerns verbalized by care team members - other than patient will need to be ambulated after his cath- prior to discharge.

## 2015-12-23 NOTE — Progress Notes (Signed)
Patient returned from cardiac cath. No interventions. Site is stable with no hematoma or bleeding and pedal pulses palpable. Vitals are stable. No pain at this time. Will continue to monitor.

## 2015-12-29 ENCOUNTER — Ambulatory Visit
Admission: RE | Admit: 2015-12-29 | Discharge: 2015-12-29 | Disposition: A | Discharge status: Home / Self Care | Payer: Medicare HMO | Source: Ambulatory Visit | Attending: Specialist | Admitting: Specialist

## 2015-12-29 DIAGNOSIS — J849 Interstitial pulmonary disease, unspecified: Secondary | ICD-10-CM | POA: Diagnosis not present

## 2015-12-29 DIAGNOSIS — R0609 Other forms of dyspnea: Secondary | ICD-10-CM | POA: Diagnosis present

## 2015-12-29 DIAGNOSIS — J8410 Pulmonary fibrosis, unspecified: Secondary | ICD-10-CM | POA: Diagnosis present

## 2015-12-29 DIAGNOSIS — R05 Cough: Secondary | ICD-10-CM | POA: Insufficient documentation

## 2015-12-29 DIAGNOSIS — J439 Emphysema, unspecified: Secondary | ICD-10-CM | POA: Insufficient documentation

## 2016-04-23 ENCOUNTER — Encounter: Payer: Self-pay | Admitting: Emergency Medicine

## 2016-04-23 ENCOUNTER — Emergency Department: Payer: Medicare Other

## 2016-04-23 ENCOUNTER — Emergency Department
Admission: EM | Admit: 2016-04-23 | Discharge: 2016-04-23 | Disposition: A | Discharge status: Home / Self Care | Payer: Medicare Other | Attending: Emergency Medicine | Admitting: Emergency Medicine

## 2016-04-23 DIAGNOSIS — R109 Unspecified abdominal pain: Secondary | ICD-10-CM | POA: Insufficient documentation

## 2016-04-23 DIAGNOSIS — Z87891 Personal history of nicotine dependence: Secondary | ICD-10-CM | POA: Diagnosis not present

## 2016-04-23 LAB — BASIC METABOLIC PANEL
Anion gap: 6 (ref 5–15)
BUN: 18 mg/dL (ref 6–20)
CALCIUM: 8.8 mg/dL — AB (ref 8.9–10.3)
CO2: 22 mmol/L (ref 22–32)
CREATININE: 1.12 mg/dL (ref 0.61–1.24)
Chloride: 113 mmol/L — ABNORMAL HIGH (ref 101–111)
GFR calc Af Amer: >60 mL/min (ref >60)
GFR, EST NON AFRICAN AMERICAN: 59 mL/min — AB (ref >60)
GLUCOSE: 135 mg/dL — AB (ref 65–99)
Potassium: 3.6 mmol/L (ref 3.5–5.1)
SODIUM: 141 mmol/L (ref 135–145)

## 2016-04-23 LAB — TROPONIN I
TROPONIN I: 0.13 ng/mL — AB (ref <0.03)
Troponin I: 0.13 ng/mL (ref <0.03)

## 2016-04-23 LAB — URINALYSIS COMPLETE WITH MICROSCOPIC (ARMC ONLY)
BACTERIA UA: NONE SEEN
BILIRUBIN URINE: NEGATIVE
GLUCOSE, UA: NEGATIVE mg/dL
HGB URINE DIPSTICK: NEGATIVE
Ketones, ur: NEGATIVE mg/dL
LEUKOCYTES UA: NEGATIVE
NITRITE: NEGATIVE
PH: 7 (ref 5.0–8.0)
Protein, ur: NEGATIVE mg/dL
SPECIFIC GRAVITY, URINE: 1.029 (ref 1.005–1.030)
Squamous Epithelial / LPF: NONE SEEN

## 2016-04-23 LAB — CBC
HCT: 38.8 % — ABNORMAL LOW (ref 40.0–52.0)
Hemoglobin: 12.6 g/dL — ABNORMAL LOW (ref 13.0–18.0)
MCH: 22.9 pg — AB (ref 26.0–34.0)
MCHC: 32.4 g/dL (ref 32.0–36.0)
MCV: 70.7 fL — AB (ref 80.0–100.0)
PLATELETS: 114 10*3/uL — AB (ref 150–440)
RBC: 5.49 MIL/uL (ref 4.40–5.90)
RDW: 15.7 % — AB (ref 11.5–14.5)
WBC: 4.4 10*3/uL (ref 3.8–10.6)

## 2016-04-23 LAB — HEPATIC FUNCTION PANEL
ALBUMIN: 3.4 g/dL — AB (ref 3.5–5.0)
ALK PHOS: 51 U/L (ref 38–126)
ALT: 15 U/L — ABNORMAL LOW (ref 17–63)
AST: 25 U/L (ref 15–41)
BILIRUBIN TOTAL: 0.6 mg/dL (ref 0.3–1.2)
Total Protein: 6.9 g/dL (ref 6.5–8.1)

## 2016-04-23 LAB — LIPASE, BLOOD: LIPASE: 32 U/L (ref 11–51)

## 2016-04-23 MED ORDER — MORPHINE SULFATE (PF) 4 MG/ML IV SOLN
4.0000 mg | Freq: Once | INTRAVENOUS | Status: AC
  Administered 2016-04-23: 4 mg via INTRAVENOUS
  Filled 2016-04-23: qty 1

## 2016-04-23 MED ORDER — IOPAMIDOL (ISOVUE-300) INJECTION 61%
100.0000 mL | Freq: Once | INTRAVENOUS | Status: AC | PRN
  Administered 2016-04-23: 100 mL via INTRAVENOUS

## 2016-04-23 MED ORDER — ONDANSETRON HCL 4 MG/2ML IJ SOLN
4.0000 mg | Freq: Once | INTRAMUSCULAR | Status: AC
  Administered 2016-04-23: 4 mg via INTRAVENOUS
  Filled 2016-04-23: qty 2

## 2016-04-23 NOTE — ED Notes (Signed)
Patient transported to CT 

## 2016-04-23 NOTE — ED Notes (Signed)
Left rib pain. Mowed the lawn yesterday, but states otherwise nothing strenuous. No pain with urination. Pain relieved with laying down, increases with certain movement.

## 2016-04-23 NOTE — ED Provider Notes (Signed)
[facility] Emergency Department Provider Note   ____________________________________________  Time seen: Approximately 11:48 AM  I have reviewed the triage vital signs and the nursing notes.   HISTORY  Chief Complaint Flank Pain    HPI Derek Bernard is a 82 y.o. male who reports onset of left sided flank pain this morning. Pain is constant and severe. Patient reports nothing seems to have brought it on nothing really makes it better. Patient reports he did mow the lawn yesterday but was fine afterwards.Besides the past medical history listed below patient denies any other medical problems   Past Medical History  Diagnosis Date  . Hypertension   . Anemia   . BPH (benign prostatic hyperplasia)   . CKD (chronic kidney disease)   . Pulmonary fibrosis (HCC)   . Sinus bradycardia   . Sick sinus syndrome [facility])     s/p pacemaker  . Gout     Patient Active Problem List   Diagnosis Date Noted  . Tendinitis 12/23/2015    Past Surgical History  Procedure Laterality Date  . Pacemaker insertion    . Cataract extraction      Right eye  . Cardiac catheterization N/A 12/23/2015    Procedure: Left Heart Cath and Coronary Angiography;  Surgeon: Lamar Blinks, MD;  Location: [facility] INVASIVE CV LAB;  Service: Cardiovascular;  Laterality: N/A;    Current Outpatient Rx  Name  Route  Sig  Dispense  Refill  . aspirin EC 81 MG tablet   Oral   Take 81 mg by mouth daily.         . brimonidine (ALPHAGAN P) 0.1 % SOLN   Both Eyes   Place 1 drop into both eyes 3 (three) times daily.         . budesonide-formoterol (SYMBICORT) 160-4.5 MCG/ACT inhaler   Inhalation   Inhale 2 puffs into the lungs 2 (two) times daily.         . celecoxib (CELEBREX) 200 MG capsule   Oral   Take 200 mg by mouth daily.         . diclofenac sodium (VOLTAREN) 1 % GEL   Topical   Apply 2 g topically 4 (four) times daily as needed (for pain).         .  dorzolamide-timolol (COSOPT) 22.3-6.8 MG/ML ophthalmic solution   Both Eyes   Place 1 drop into both eyes 2 (two) times daily.         . fexofenadine (ALLEGRA) 180 MG tablet   Oral   Take 180 mg by mouth daily.         . fluticasone (FLONASE) 50 MCG/ACT nasal spray   Each Nare   Place 2 sprays into both nostrils daily.         . furosemide (LASIX) 20 MG tablet   Oral   Take 20 mg by mouth daily.         Marland Kitchen latanoprost (XALATAN) 0.005 % ophthalmic solution   Both Eyes   Place 1 drop into both eyes at bedtime.         . montelukast (SINGULAIR) 10 MG tablet   Oral   Take 10 mg by mouth daily.         Marland Kitchen omeprazole (PRILOSEC) 20 MG capsule   Oral   Take 40 mg by mouth daily before breakfast.         . potassium chloride (K-DUR) 10 MEQ tablet   Oral   Take  10 mEq by mouth daily.         . pravastatin (PRAVACHOL) 40 MG tablet   Oral   Take 40 mg by mouth at bedtime.         Marland Kitchen zolpidem (AMBIEN) 5 MG tablet   Oral   Take 5 mg by mouth at bedtime as needed for sleep.           Allergies Review of patient's allergies indicates no known allergies.  History reviewed. No pertinent family history.  Social History Social History  Substance Use Topics  . Smoking status: Former Games developer  . Smokeless tobacco: None     Comment: Quit 20 years ago  . Alcohol Use: No    Review of Systems Constitutional: No fever/chills Eyes: No visual changes. ENT: No sore throat. Cardiovascular: Denies chest pain. Respiratory: Denies shortness of breath. Gastrointestinal: abdominal pain. nausea, no vomiting.  No diarrhea.  No constipation. Genitourinary: Negative for dysuria. Musculoskeletal: Negative for back pain. Skin: Negative for rash. Neurological: Negative for headaches, focal weakness or numbness.  10-point ROS otherwise negative.  ____________________________________________   PHYSICAL EXAM:  VITAL SIGNS: ED Triage Vitals  Enc Vitals Group     BP  04/23/16 1118 161/84 mmHg     Pulse Rate 04/23/16 1118 64     Resp 04/23/16 1118 18     Temp 04/23/16 1118 97.7 F (36.5 C)     Temp Source 04/23/16 1118 Oral     SpO2 04/23/16 1118 100 %     Weight 04/23/16 1118 171 lb (77.565 kg)     Height 04/23/16 1118  (1.727 m)     Head Cir --      Peak Flow --      Pain Score 04/23/16 1119 10     Pain Loc --      Pain Edu? --      Excl. in GC? --     Constitutional: Alert and oriented. Looks uncomfortable Eyes: Conjunctivae are normal. PERRL. EOMI. Head: Atraumatic. Nose: No congestion/rhinnorhea. Mouth/Throat: Mucous membranes are moist.  Oropharynx non-erythematous. Neck: No stridor.  Cardiovascular: Normal rate, regular rhythm. Grossly normal heart sounds.  Good peripheral circulation. Respiratory: Normal respiratory effort.  No retractions. Lungs CTAB. Gastrointestinal: Soft and nontender. No distention. No abdominal bruits. Patient does have some pain in the left CVA area and below the left CVA area all the way down to the pelvic brim posteriorly. Patient does not have any anterior abdominal pain }Musculoskeletal: No lower extremity tenderness nor edema.  No joint effusions. Neurologic:  Normal speech and language. No gross focal neurologic deficits are appreciated. No gait instability. Skin:  Skin is warm, dry and intact. No rash noted. Psychiatric: Mood and affect are normal. Speech and behavior are normal.  ____________________________________________   LABS (all labs ordered are listed, but only abnormal results are displayed)  Labs Reviewed  CBC - Abnormal; Notable for the following:    Hemoglobin 12.6 (*)    HCT 38.8 (*)    MCV 70.7 (*)    MCH 22.9 (*)    RDW 15.7 (*)    Platelets 114 (*)    All other components within normal limits  BASIC METABOLIC PANEL - Abnormal; Notable for the following:    Chloride 113 (*)    Glucose, Bld 135 (*)    Calcium 8.8 (*)    GFR calc non Af Amer 59 (*)    All other components  within normal limits  TROPONIN I - Abnormal;  Notable for the following:    Troponin I 0.13 (*)    All other components within normal limits  HEPATIC FUNCTION PANEL - Abnormal; Notable for the following:    Albumin 3.4 (*)    ALT 15 (*)    Bilirubin, Direct <0.1 (*)    All other components within normal limits  TROPONIN I - Abnormal; Notable for the following:    Troponin I 0.13 (*)    All other components within normal limits  LIPASE, BLOOD  URINALYSIS COMPLETEWITH MICROSCOPIC ([facility] ONLY)   ____________________________________________  EKG  KG read and interpreted by me shows atrial paced rhythm at 60 no acute changes ____________________________________________  RADIOLOGY  CLINICAL DATA: Left chest and flank pain 2 hours ago. Elevated troponin. Ex-smoker. History of benign prostatic hyperplasia. Pulmonary fibrosis. Gout.  EXAM: CT CHEST, ABDOMEN, AND PELVIS WITH CONTRAST  TECHNIQUE: Multidetector CT imaging of the chest, abdomen and pelvis was performed following the standard protocol during bolus administration of intravenous contrast.  CONTRAST: ISOVUE-300 IOPAMIDOL (ISOVUE-300) INJECTION 61%  COMPARISON: Chest CT 12/29/2015. No prior abdominal imaging.  FINDINGS: CT CHEST FINDINGS  Mediastinum/Lymph Nodes: Dual lead pacer. Mild cardiomegaly. Lad coronary artery atherosclerosis. No pericardial effusion. Mild pulmonary artery enlargement, 3.2 cm outflow tract. No central pulmonary embolism, on this non-dedicated study.  No mediastinal or hilar adenopathy.  Lungs/Pleura: No pleural fluid. A nodule along the right minor fissure measures 8 mm on image 69/series 4 and is unchanged. Redemonstrated is interstitial lung disease, with areas of subpleural reticulation bilaterally. There are areas of traction bronchiectasis and architectural distortion, most apparent at the anteromedial left upper lobe. mild honeycombing in this area, including on  image 61/series 4. No lobar consolidation.  Musculoskeletal: No acute osseous abnormality. Probable sebaceous cyst superficial to the sternum.  CT ABDOMEN PELVIS FINDINGS  Hepatobiliary: Normal liver. Multiple gallstones on the order of 5 mm. No surrounding inflammation or biliary duct dilatation.  Pancreas: Normal pancreas for age, without duct dilatation or acute inflammation.  Spleen: Normal in size, without focal abnormality.  Adrenals/Urinary Tract: Normal adrenal glands. Upper pole left renal cysts of maximally 3.5 cm. No hydronephrosis. Neobladder is unremarkable. In the region of the prostatic urethra (possibly the site of a prior TURP) is a 4 mm calcification on image 106/series 2.  Stomach/Bowel: Normal stomach, without wall thickening. Normal colon and terminal ileum. Normal small bowel.  Vascular/Lymphatic: Aortic and branch vessel atherosclerosis. No abdominopelvic adenopathy.  Reproductive: Otherwise normal prostate.  Other: No significant free fluid. Small fat containing left inguinal hernia.  Musculoskeletal: Degenerative partial fusion of the bilateral sacroiliac joints. Lumbosacral spondylosis is advanced.  IMPRESSION: CT CHEST IMPRESSION  1. No acute process in the chest. 2. 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 Pulmonary artery enlargement suggests pulmonary arterial hypertension. 3. 8 mm perifissural right-sided lung nodule is most likely a subpleural lymph node. Non-contrast chest CT at 6-12 months is recommended. If the nodule is stable at time of repeat CT, then future CT at 18-24 months (from today's scan) is considered optional for low-risk patients, but is recommended for high-risk patients. This recommendation follows the consensus statement: Guidelines for Management of Incidental Pulmonary Nodules Detected on CT Images:From the Fleischner Society 2017; published online before print  (10.1148/radiol.1610960454). 4. Coronary artery atherosclerosis. Aortic atherosclerosis.  CT ABDOMEN AND PELVIS IMPRESSION  1. Calcification in the region of the prostatic urethra could be dystrophic/prostatic in nature. However, a recently passed stone positioned within the urethra cannot be excluded. Correlate with urinalysis ,especially  given history of acute left flank pain. 2. Cholelithiasis. 3. Aortic atherosclerosis.   Electronically Signed  By: Jeronimo Greaves M.D.  On: 04/23/2016 13:56 ____________________________________________   PROCEDURES    Procedures    ____________________________________________   INITIAL IMPRESSION / ASSESSMENT AND PLAN / ED COURSE  Pertinent labs & imaging results that were available during my care of the patient were reviewed by me and considered in my medical decision making (see chart for details).  Patient has had a recent catheter that showed only mild mild disease. His troponins have been about this level even before the catheter. Chronic elevation. ____________________________________________   FINAL CLINICAL IMPRESSION(S) / ED DIAGNOSES  Final diagnoses:  Flank pain      NEW MEDICATIONS STARTED DURING THIS VISIT:  New Prescriptions   No medications on file     Note:  This document was prepared using Dragon voice recognition software and may include unintentional dictation errors.    Arnaldo Natal, MD 04/23/16 [phone]

## 2016-04-23 NOTE — ED Notes (Signed)
Pt to ed with c/o left flank pain that started this am.  Pt denies difficulty with urination.  Pt alert and oriented.  Pt states severe constant pain.

## 2016-04-23 NOTE — Discharge Instructions (Signed)
Flank Pain Flank pain refers to pain that is located on the side of the body between the upper abdomen and the back. The pain may occur over a short period of time (acute) or may be long-term or reoccurring (chronic). It may be mild or severe. Flank pain can be caused by many things. CAUSES  Some of the more common causes of flank pain include:  Muscle strains.   Muscle spasms.   A disease of your spine (vertebral disk disease).   A lung infection (pneumonia).   Fluid around your lungs (pulmonary edema).   A kidney infection.   Kidney stones.   A very painful skin rash caused by the chickenpox virus (shingles).   Gallbladder disease.  HOME CARE INSTRUCTIONS  Home care will depend on the cause of your pain. In general,  Rest as directed by your caregiver.  Drink enough fluids to keep your urine clear or pale yellow.  Only take over-the-counter or prescription medicines as directed by your caregiver. Some medicines may help relieve the pain.  Tell your caregiver about any changes in your pain.  Follow up with your caregiver as directed. SEEK IMMEDIATE MEDICAL CARE IF:   Your pain is not controlled with medicine.   You have new or worsening symptoms.  Your pain increases.   You have abdominal pain.   You have shortness of breath.   You have persistent nausea or vomiting.   You have swelling in your abdomen.   You feel faint or pass out.   You have blood in your urine.  You have a fever or persistent symptoms for more than 2-3 days.  You have a fever and your symptoms suddenly get worse. MAKE SURE YOU:   Understand these instructions.  Will watch your condition.  Will get help right away if you are not doing well or get worse.   This information is not intended to replace advice given to you by your health care provider. Make sure you discuss any questions you have with your health care provider.   Document Released: 11/10/2005 Document  Revised: 06/13/2012 Document Reviewed: 05/03/2012 Elsevier Interactive Patient Education 2016 [employer].  Pain could be from a recently passed stone. If it is usually should urinate the stone out. It will hurt briefly and then it should be better. He can try to strain the urine and catch the stone. Follow up with urology. If you need some pain medicine I will give you some Vicodin one pill 4 times a day if you need it. He carefully can make you woozy. He can also make you constipated. Do not drive if you're taking it. Be careful not to fall. Please return for increased pain fever vomiting or feeling sicker. Follow up with your regular doctor. Remember you have the spots on your lung. Her doctor should continue to keep an eye on those. He may decide to repeat a CAT scan in about a year.

## 2016-07-04 DIAGNOSIS — Z950 Presence of cardiac pacemaker: Secondary | ICD-10-CM | POA: Insufficient documentation

## 2017-01-24 ENCOUNTER — Encounter: Payer: Self-pay | Admitting: Oncology

## 2017-01-24 ENCOUNTER — Inpatient Hospital Stay: Payer: Medicare Other | Attending: Oncology | Admitting: Oncology

## 2017-01-24 ENCOUNTER — Inpatient Hospital Stay: Payer: Medicare Other

## 2017-01-24 VITALS — BP 164/86 | HR 86 | Temp 98.5°F | Resp 18 | Ht 63.39 in | Wt 171.4 lb

## 2017-01-24 DIAGNOSIS — Z79899 Other long term (current) drug therapy: Secondary | ICD-10-CM | POA: Diagnosis not present

## 2017-01-24 DIAGNOSIS — D509 Iron deficiency anemia, unspecified: Secondary | ICD-10-CM | POA: Diagnosis not present

## 2017-01-24 DIAGNOSIS — R970 Elevated carcinoembryonic antigen [CEA]: Secondary | ICD-10-CM

## 2017-01-24 DIAGNOSIS — R634 Abnormal weight loss: Secondary | ICD-10-CM | POA: Diagnosis not present

## 2017-01-24 DIAGNOSIS — I129 Hypertensive chronic kidney disease with stage 1 through stage 4 chronic kidney disease, or unspecified chronic kidney disease: Secondary | ICD-10-CM | POA: Diagnosis not present

## 2017-01-24 DIAGNOSIS — Z7982 Long term (current) use of aspirin: Secondary | ICD-10-CM | POA: Diagnosis not present

## 2017-01-24 DIAGNOSIS — E785 Hyperlipidemia, unspecified: Secondary | ICD-10-CM | POA: Insufficient documentation

## 2017-01-24 DIAGNOSIS — M109 Gout, unspecified: Secondary | ICD-10-CM | POA: Diagnosis not present

## 2017-01-24 DIAGNOSIS — Z87891 Personal history of nicotine dependence: Secondary | ICD-10-CM | POA: Diagnosis not present

## 2017-01-24 DIAGNOSIS — I495 Sick sinus syndrome: Secondary | ICD-10-CM | POA: Insufficient documentation

## 2017-01-24 DIAGNOSIS — Z950 Presence of cardiac pacemaker: Secondary | ICD-10-CM | POA: Insufficient documentation

## 2017-01-24 DIAGNOSIS — J8410 Pulmonary fibrosis, unspecified: Secondary | ICD-10-CM | POA: Insufficient documentation

## 2017-01-24 DIAGNOSIS — N189 Chronic kidney disease, unspecified: Secondary | ICD-10-CM | POA: Diagnosis not present

## 2017-01-24 DIAGNOSIS — F0390 Unspecified dementia without behavioral disturbance: Secondary | ICD-10-CM | POA: Insufficient documentation

## 2017-01-24 DIAGNOSIS — N400 Enlarged prostate without lower urinary tract symptoms: Secondary | ICD-10-CM | POA: Insufficient documentation

## 2017-01-24 DIAGNOSIS — K219 Gastro-esophageal reflux disease without esophagitis: Secondary | ICD-10-CM | POA: Diagnosis not present

## 2017-01-24 LAB — COMPREHENSIVE METABOLIC PANEL
ALBUMIN: 3.6 g/dL (ref 3.5–5.0)
ALK PHOS: 60 U/L (ref 38–126)
ALT: 15 U/L — AB (ref 17–63)
ANION GAP: 5 (ref 5–15)
AST: 24 U/L (ref 15–41)
BUN: 15 mg/dL (ref 6–20)
CALCIUM: 9.3 mg/dL (ref 8.9–10.3)
CO2: 27 mmol/L (ref 22–32)
Chloride: 107 mmol/L (ref 101–111)
Creatinine, Ser: 1.06 mg/dL (ref 0.61–1.24)
GFR calc non Af Amer: >60 mL/min (ref >60)
Glucose, Bld: 164 mg/dL — ABNORMAL HIGH (ref 65–99)
POTASSIUM: 3.8 mmol/L (ref 3.5–5.1)
SODIUM: 139 mmol/L (ref 135–145)
Total Bilirubin: 0.5 mg/dL (ref 0.3–1.2)
Total Protein: 7.6 g/dL (ref 6.5–8.1)

## 2017-01-24 LAB — CBC WITH DIFFERENTIAL/PLATELET
BASOS ABS: 0 10*3/uL (ref 0–0.1)
BASOS PCT: 1 %
EOS ABS: 0.2 10*3/uL (ref 0–0.7)
EOS PCT: 3 %
HCT: 42.2 % (ref 40.0–52.0)
Hemoglobin: 13.3 g/dL (ref 13.0–18.0)
Lymphocytes Relative: 26 %
Lymphs Abs: 1.3 10*3/uL (ref 1.0–3.6)
MCH: 22.9 pg — ABNORMAL LOW (ref 26.0–34.0)
MCHC: 31.5 g/dL — AB (ref 32.0–36.0)
MCV: 72.6 fL — ABNORMAL LOW (ref 80.0–100.0)
Monocytes Absolute: 0.5 10*3/uL (ref 0.2–1.0)
Monocytes Relative: 10 %
Neutro Abs: 3 10*3/uL (ref 1.4–6.5)
Neutrophils Relative %: 60 %
Platelets: 112 10*3/uL — ABNORMAL LOW (ref 150–440)
RBC: 5.81 MIL/uL (ref 4.40–5.90)
RDW: 15.9 % — AB (ref 11.5–14.5)
WBC: 5 10*3/uL (ref 3.8–10.6)

## 2017-01-24 LAB — IRON AND TIBC
IRON: 53 ug/dL (ref 45–182)
SATURATION RATIOS: 19 % (ref 17.9–39.5)
TIBC: 279 ug/dL (ref 250–450)
UIBC: 226 ug/dL

## 2017-01-24 LAB — VITAMIN B12: VITAMIN B 12: 310 pg/mL (ref 180–914)

## 2017-01-24 LAB — FERRITIN: Ferritin: 351 ng/mL — ABNORMAL HIGH (ref 24–336)

## 2017-01-24 LAB — FOLATE: Folate: 36 ng/mL (ref >5.9)

## 2017-01-24 NOTE — Progress Notes (Signed)
  Oncology Nurse Navigator Documentation Met with Derek Bernard before and after consult with Dr. Janese Banks. Introduced Therapist, nutritional and provided contact information for any future needs/education/support. Plan to include imaging and referral back to [facility] for possible colonoscopy. Escorted to lab. No further questions at this time. Navigator Location: [facility] (01/24/17 1500)   )Navigator Encounter Type: Initial MedOnc;Diagnostic Results (01/24/17 1500)                     Patient Visit Type: MedOnc;Initial (01/24/17 1500)   Barriers/Navigation Needs: No barriers at this time (01/24/17 1500)                Acuity: Level 2 (01/24/17 1500)   Acuity Level 2: Initial guidance, education and coordination as needed;Educational needs;Ongoing guidance and education throughout treatment as needed (01/24/17 1500)     Time Spent with Patient: 30 (01/24/17 1500)

## 2017-01-24 NOTE — Progress Notes (Signed)
Hematology/Oncology Consult note [facility] [phone] [phone]  Patient Care Team: Pcp Not In System as PCP - General Benita Gutter, RN as Registered Nurse   Name of the patient: Derek Bernard  [id number]  01-16-1930    Reason for referral- elevated CEA. Weight loss. Concern for malignancy   Referring physician- Dr. Ellsworth Lennox  Date of visit: 01/24/17   History of presenting illness- patient is a 83 year old male with a past medical history significant for dementia, hyperlipidemia, GERD, and other medical problems. There was an ongoing concern about loss when Dr. Ellsworth Lennox saw him recently. Patient states that he may have lost about 10 pounds of weight over the last couple of years but he is not sure. There was also concern about GI bleed however patient denies any blood in his sputum or urine. CBC checked on 12/30/2016 showed white count of 4, H&H of 11.6/39.4 with an MCV of 74 and platelet count of 119. BMP and liver functions were within normal limits. Patient had a CEA checked which was mildly elevated at's 0.3 he also had a 125 CA 2729 checked which were elevated at 51.5 and 48.7 respectively.He has been referred to [place] for suspected malignancy.  Patient lives alone and is independent of his ADLs and IADLs. He is able to drive. He does not know much about his medical history and medications that he is on. He is you are with his son today who is also not aware off his medications and medical problems in detail. Overall patient reports feeling well and denies any pain, fatigue.  Patient states that he has been seen by [facility] in the past and has undergone a colonoscopy about 7 years ago  ECOG PS- 1  Pain scale- 0   Review of systems- Review of Systems  Constitutional: Negative for chills, fever, malaise/fatigue and weight loss.  HENT: Negative for congestion, ear discharge and nosebleeds.   Eyes: Negative for blurred  vision.  Respiratory: Negative for cough, hemoptysis, sputum production, shortness of breath and wheezing.   Cardiovascular: Negative for chest pain, palpitations, orthopnea and claudication.  Gastrointestinal: Negative for abdominal pain, blood in stool, constipation, diarrhea, heartburn, melena, nausea and vomiting.  Genitourinary: Negative for dysuria, flank pain, frequency, hematuria and urgency.  Musculoskeletal: Negative for back pain, joint pain and myalgias.  Skin: Negative for rash.  Neurological: Negative for dizziness, tingling, focal weakness, seizures, weakness and headaches.  Endo/Heme/Allergies: Does not bruise/bleed easily.  Psychiatric/Behavioral: Negative for depression and suicidal ideas. The patient does not have insomnia.     No Known Allergies  Patient Active Problem List   Diagnosis Date Noted  . Tendinitis 12/23/2015     Past Medical History:  Diagnosis Date  . Anemia   . BPH (benign prostatic hyperplasia)   . CKD (chronic kidney disease)   . Gout   . Hypertension   . Memory change   . Pacemaker 2010  . Pulmonary fibrosis (HCC)   . Sick sinus syndrome [facility])    s/p pacemaker  . Sinus bradycardia      Past Surgical History:  Procedure Laterality Date  . CARDIAC CATHETERIZATION N/A 12/23/2015   Procedure: Left Heart Cath and Coronary Angiography;  Surgeon: Lamar Blinks, MD;  Location: [facility] INVASIVE CV LAB;  Service: Cardiovascular;  Laterality: N/A;  . CATARACT EXTRACTION     Right eye  . PACEMAKER INSERTION      Social History   Social History  . Marital  status: Widowed    Spouse name: N/A  . Number of children: N/A  . Years of education: N/A   Occupational History  . Not on file.   Social History Main Topics  . Smoking status: Former Smoker    Packs/day: 0.50    Years: 20.00    Types: Cigarettes    Quit date: 64  . Smokeless tobacco: Never Used     Comment: Quit 20 years ago  . Alcohol use No  . Drug use: No  . Sexual  activity: No   Other Topics Concern  . Not on file   Social History Narrative   Independent, lives at home by himself.     History reviewed. No pertinent family history.   Current Outpatient Prescriptions:  .  aspirin EC 81 MG tablet, Take 81 mg by mouth daily., Disp: , Rfl:  .  brimonidine (ALPHAGAN P) 0.1 % SOLN, Place 1 drop into both eyes 3 (three) times daily., Disp: , Rfl:  .  budesonide-formoterol (SYMBICORT) 160-4.5 MCG/ACT inhaler, Inhale 2 puffs into the lungs 2 (two) times daily., Disp: , Rfl:  .  celecoxib (CELEBREX) 200 MG capsule, Take 200 mg by mouth daily., Disp: , Rfl:  .  donepezil (ARICEPT) 10 MG tablet, , Disp: , Rfl:  .  dorzolamide-timolol (COSOPT) 22.3-6.8 MG/ML ophthalmic solution, Place 1 drop into both eyes 2 (two) times daily., Disp: , Rfl:  .  furosemide (LASIX) 20 MG tablet, Take 20 mg by mouth daily., Disp: , Rfl:  .  omeprazole (PRILOSEC) 20 MG capsule, Take 40 mg by mouth daily before breakfast., Disp: , Rfl:  .  pantoprazole (PROTONIX) 40 MG tablet, , Disp: , Rfl:  .  potassium chloride (K-DUR) 10 MEQ tablet, Take 10 mEq by mouth daily., Disp: , Rfl:  .  pravastatin (PRAVACHOL) 40 MG tablet, Take 40 mg by mouth at bedtime., Disp: , Rfl:  .  diclofenac sodium (VOLTAREN) 1 % GEL, Apply 2 g topically 4 (four) times daily as needed (for pain)., Disp: , Rfl:  .  fluticasone (FLONASE) 50 MCG/ACT nasal spray, Place 2 sprays into both nostrils daily., Disp: , Rfl:  .  latanoprost (XALATAN) 0.005 % ophthalmic solution, Place 1 drop into both eyes at bedtime., Disp: , Rfl:  .  montelukast (SINGULAIR) 10 MG tablet, Take 10 mg by mouth daily., Disp: , Rfl:  .  zolpidem (AMBIEN) 5 MG tablet, Take 1 tablet by mouth at bedtime as needed [for insomina], Disp: , Rfl:    Physical exam:  Vitals:   01/24/17 1439  BP: (!) 164/86  Pulse: 86  Resp: 18  Temp: 98.5 F (36.9 C)  TempSrc: Tympanic  Weight: 171 lb 6.4 oz (77.7 kg)  Height: 5' 3.39" (1.61 m)   Physical  Exam  Constitutional: He is oriented to person, place, and time and well-developed, well-nourished, and in no distress.  HENT:  Head: Normocephalic and atraumatic.  Eyes: EOM are normal. Pupils are equal, round, and reactive to light.  Neck: Normal range of motion.  Cardiovascular: Normal rate, regular rhythm and normal heart sounds.   Pulmonary/Chest: Effort normal and breath sounds normal.  Abdominal: Soft. Bowel sounds are normal.  Neurological: He is alert and oriented to person, place, and time.  Skin: Skin is warm and dry.       CMP Latest Ref Rng & Units 04/23/2016  Glucose 65 - 99 mg/dL 409(W)  BUN 6 - 20 mg/dL 18  Creatinine 1.19 - 1.47 mg/dL 8.29  Sodium 135 - 145 mmol/L 141  Potassium 3.5 - 5.1 mmol/L 3.6  Chloride 101 - 111 mmol/L 113(H)  CO2 22 - 32 mmol/L 22  Calcium 8.9 - 10.3 mg/dL 4.0(J)  Total Protein 6.5 - 8.1 g/dL 6.9  Total Bilirubin 0.3 - 1.2 mg/dL 0.6  Alkaline Phos 38 - 126 U/L 51  AST 15 - 41 U/L 25  ALT 17 - 63 U/L 15(L)   CBC Latest Ref Rng & Units 01/24/2017  WBC 3.8 - 10.6 K/uL 5.0  Hemoglobin 13.0 - 18.0 g/dL 81.1  Hematocrit 91.4 - 52.0 % 42.2  Platelets 150 - 440 K/uL 112(L)     Assessment and plan- Patient is a 83 y.o. male was then referred to [place] for weight loss, suspected GI bleed, elevated CEA for concerns of malignancy  I explained to the patient the elevated CEA is nonspecific and does not necessarily indicate malignancy. CA 125 and CA 24-25 is used as a prognostic marker in ovarian and breast cancer respectively and would not apply in his case. I also asked him as to how much she would like to delve into a possible malignancy workup given that he is 83 years of age and does not have any overt complaints today. He would like to get further workup and hence I will go ahead and get a CT chest as well as a CT abdomen and pelvis to look for this possible malignancy. I will also refer him back to [facility] GI to discuss possible colonoscopy  given concern for GI bleed and microcytic anemia. I will see him back in 10 days to discuss results of his CT scan. I will obtain cbc and cmp along with iron studies today   Thank you for this kind referral and the opportunity to participate in the care of this patient   Visit Diagnosis 1. Microcytic anemia   2. Elevated CEA     Dr. Owens Shark, MD, MPH [facility] at [facility] Pager- [phone] 01/24/2017 4:13 PM

## 2017-01-24 NOTE — Progress Notes (Signed)
Pt here as new pt.

## 2017-01-25 ENCOUNTER — Other Ambulatory Visit: Payer: Self-pay | Admitting: *Deleted

## 2017-01-25 DIAGNOSIS — R970 Elevated carcinoembryonic antigen [CEA]: Secondary | ICD-10-CM

## 2017-01-25 DIAGNOSIS — R634 Abnormal weight loss: Secondary | ICD-10-CM

## 2017-01-26 ENCOUNTER — Telehealth: Payer: Self-pay

## 2017-01-26 NOTE — Telephone Encounter (Signed)
Spoke with patient CT @ 1pm on 5/8 [facility] .  Pt was in the car and will call back to get information again once he gets home.  Appt reminder will be mailed out.

## 2017-01-27 ENCOUNTER — Encounter: Payer: Self-pay | Admitting: *Deleted

## 2017-01-30 ENCOUNTER — Telehealth: Payer: Self-pay | Admitting: *Deleted

## 2017-01-30 NOTE — Telephone Encounter (Signed)
Called pt on Friday and no answer so I left him a message that I need to contact him about his appt and ct scan.  I told him on my message that I would try his son because he came with him at the first appt.  Called son and his wife answered the phone and told me that she knows about her father in law and he does not need a scan. He just had one at [facility].  I told her that I would call and check to see if we could get a copy and let her know. She said that is fine.  I got the copy of ct and dr. Smith Robert reviewed it and he does not need a ct so I called and cancelled it  for5/8.  I then called pt and left him a message that he does not need scan on 5/8 since he had one at [facility].  I then called son's phone and spoke to DIL again and let her know that we were able to get [facility] fro [facility] and he does not need to have the scan 5/8 so I cancelled it.  I asked if he is still coming to his 5/3 appt. She states he does not need it because he had scan already.  I told her that now that we have the scan we will make a plan for him. She states he does not have extra money to keep coming over and he has appt with PCP and if they need  Him to come back over they will let them know.  I told her that he was sent there because of labs and scan but we did not know he had one and now that we have one she is ready to tell him her plan but he would have to come in and anytime you see md it is a copay.  She states again that she will speak to PCP and if he needs to come back they will get in touch with [place].

## 2017-02-02 ENCOUNTER — Ambulatory Visit: Payer: Medicare Other | Admitting: Oncology

## 2017-02-07 ENCOUNTER — Ambulatory Visit: Payer: Medicare Other

## 2017-03-30 ENCOUNTER — Encounter: Payer: Self-pay | Admitting: Internal Medicine

## 2017-04-03 ENCOUNTER — Encounter: Payer: Self-pay | Admitting: Internal Medicine

## 2017-04-18 ENCOUNTER — Inpatient Hospital Stay: Payer: Self-pay | Admitting: Oncology

## 2017-05-04 ENCOUNTER — Inpatient Hospital Stay: Payer: Medicare HMO | Admitting: Oncology

## 2017-05-09 ENCOUNTER — Inpatient Hospital Stay: Payer: Medicare HMO

## 2017-05-09 ENCOUNTER — Inpatient Hospital Stay: Payer: Medicare HMO | Attending: Oncology | Admitting: Oncology

## 2017-05-09 VITALS — BP 132/73 | HR 65 | Temp 97.4°F | Resp 18 | Wt 170.0 lb

## 2017-05-09 DIAGNOSIS — M109 Gout, unspecified: Secondary | ICD-10-CM | POA: Insufficient documentation

## 2017-05-09 DIAGNOSIS — N400 Enlarged prostate without lower urinary tract symptoms: Secondary | ICD-10-CM | POA: Insufficient documentation

## 2017-05-09 DIAGNOSIS — R718 Other abnormality of red blood cells: Secondary | ICD-10-CM

## 2017-05-09 DIAGNOSIS — Z79899 Other long term (current) drug therapy: Secondary | ICD-10-CM | POA: Insufficient documentation

## 2017-05-09 DIAGNOSIS — Z87891 Personal history of nicotine dependence: Secondary | ICD-10-CM | POA: Insufficient documentation

## 2017-05-09 DIAGNOSIS — I10 Essential (primary) hypertension: Secondary | ICD-10-CM | POA: Insufficient documentation

## 2017-05-09 DIAGNOSIS — R970 Elevated carcinoembryonic antigen [CEA]: Secondary | ICD-10-CM | POA: Diagnosis not present

## 2017-05-09 DIAGNOSIS — Z950 Presence of cardiac pacemaker: Secondary | ICD-10-CM | POA: Insufficient documentation

## 2017-05-09 DIAGNOSIS — N189 Chronic kidney disease, unspecified: Secondary | ICD-10-CM | POA: Insufficient documentation

## 2017-05-09 DIAGNOSIS — R7989 Other specified abnormal findings of blood chemistry: Secondary | ICD-10-CM | POA: Diagnosis not present

## 2017-05-09 DIAGNOSIS — R634 Abnormal weight loss: Secondary | ICD-10-CM | POA: Insufficient documentation

## 2017-05-09 DIAGNOSIS — D696 Thrombocytopenia, unspecified: Secondary | ICD-10-CM | POA: Diagnosis not present

## 2017-05-09 LAB — CBC WITH DIFFERENTIAL/PLATELET
BASOS ABS: 0 10*3/uL (ref 0–0.1)
BASOS PCT: 1 %
EOS ABS: 0.2 10*3/uL (ref 0–0.7)
Eosinophils Relative: 4 %
HEMATOCRIT: 40 % (ref 40.0–52.0)
HEMOGLOBIN: 12.7 g/dL — AB (ref 13.0–18.0)
Lymphocytes Relative: 38 %
Lymphs Abs: 2 10*3/uL (ref 1.0–3.6)
MCH: 23.1 pg — ABNORMAL LOW (ref 26.0–34.0)
MCHC: 31.8 g/dL — ABNORMAL LOW (ref 32.0–36.0)
MCV: 72.5 fL — ABNORMAL LOW (ref 80.0–100.0)
Monocytes Absolute: 0.7 10*3/uL (ref 0.2–1.0)
Monocytes Relative: 13 %
NEUTROS ABS: 2.3 10*3/uL (ref 1.4–6.5)
NEUTROS PCT: 44 %
PLATELETS: 106 10*3/uL — AB (ref 150–440)
RBC: 5.51 MIL/uL (ref 4.40–5.90)
RDW: 15.6 % — AB (ref 11.5–14.5)
WBC: 5.2 10*3/uL (ref 3.8–10.6)

## 2017-05-09 LAB — FERRITIN: FERRITIN: 409 ng/mL — AB (ref 24–336)

## 2017-05-09 LAB — IRON AND TIBC
IRON: 39 ug/dL — AB (ref 45–182)
Saturation Ratios: 15 % — ABNORMAL LOW (ref 17.9–39.5)
TIBC: 270 ug/dL (ref 250–450)
UIBC: 231 ug/dL

## 2017-05-09 NOTE — Progress Notes (Signed)
Patient offers no complaints today.  Patient states he was seen here in April and was supposed to have had a test but he did not go to that appointment.  Referred back by Dr. Debbra Ridingejan-Die regarding weight loss and elevated CEA.

## 2017-05-11 ENCOUNTER — Encounter: Payer: Self-pay | Admitting: Oncology

## 2017-05-11 LAB — HEMOGLOBINOPATHY EVALUATION
HGB A: 98 % (ref 96.4–98.8)
HGB C: 0 %
HGB VARIANT: 0 %
Hgb A2 Quant: 2 % (ref 1.8–3.2)
Hgb F Quant: 0 % (ref 0.0–2.0)
Hgb S Quant: 0 %

## 2017-05-11 NOTE — Progress Notes (Signed)
Hematology/Oncology Consult note [facility]  Telephone:([phone] [phone]  Patient Care Team: System, Pcp Not In as PCP - General Benita Gutter, RN as Registered Nurse   Name of the patient: Derek Bernard  [id number]  1930-02-09   Date of visit: 05/11/17  Diagnosis- 1. Elevated CEA 2. Microcytosis without anemia  Chief complaint/ Reason for visit- routine f/u  Heme/Onc history: patient is a 83 year old male with a past medical history significant for dementia, hyperlipidemia, GERD, and other medical problems. There was an ongoing concern about loss when Dr. Ellsworth Lennox saw him recently. Patient states that he may have lost about 10 pounds of weight over the last couple of years but he is not sure. There was also concern about GI bleed however patient denies any blood in his sputum or urine. CBC checked on 12/30/2016 showed white count of 4, H&H of 11.6/39.4 with an MCV of 74 and platelet count of 119. BMP and liver functions were within normal limits. Patient had a CEA checked which was mildly elevated at's 0.3 he also had a 125 CA 2729 checked which were elevated at 51.5 and 48.7 respectively.He has been referred to [place] for suspected malignancy.  Patient lives alone and is independent of his ADLs and IADLs. He is able to drive. He does not know much about his medical history and medications that he is on. He is you are with his son today who is also not aware off his medications and medical problems in detail. Overall patient reports feeling well and denies any pain, fatigue.  Patient states that he has been seen by [facility] in the past and has undergone a colonoscopy about 7 years ago  Ct chest abdomen and plevis done by pcp in April 2018 showed no evidence of malignany  Interval history- today he feels well. Denies any complaints. Weight has been stable. No melena or blood noted in stools  ECOG PS- 1 Pain scale- 0   Review of  systems- Review of Systems  Constitutional: Negative for chills, fever, malaise/fatigue and weight loss.  HENT: Negative for congestion, ear discharge and nosebleeds.   Eyes: Negative for blurred vision.  Respiratory: Negative for cough, hemoptysis, sputum production, shortness of breath and wheezing.   Cardiovascular: Negative for chest pain, palpitations, orthopnea and claudication.  Gastrointestinal: Negative for abdominal pain, blood in stool, constipation, diarrhea, heartburn, melena, nausea and vomiting.  Genitourinary: Negative for dysuria, flank pain, frequency, hematuria and urgency.  Musculoskeletal: Negative for back pain, joint pain and myalgias.  Skin: Negative for rash.  Neurological: Negative for dizziness, tingling, focal weakness, seizures, weakness and headaches.  Endo/Heme/Allergies: Does not bruise/bleed easily.  Psychiatric/Behavioral: Negative for depression and suicidal ideas. The patient does not have insomnia.       No Known Allergies   Past Medical History:  Diagnosis Date  . Anemia   . BPH (benign prostatic hyperplasia)   . CKD (chronic kidney disease)   . Gout   . Hypertension   . Memory change   . Pacemaker 2010  . Pulmonary fibrosis (HCC)   . Sick sinus syndrome [facility])    s/p pacemaker  . Sinus bradycardia      Past Surgical History:  Procedure Laterality Date  . CARDIAC CATHETERIZATION N/A 12/23/2015   Procedure: Left Heart Cath and Coronary Angiography;  Surgeon: Lamar Blinks, MD;  Location: [facility] INVASIVE CV LAB;  Service: Cardiovascular;  Laterality: N/A;  . CATARACT EXTRACTION  Right eye  . PACEMAKER INSERTION      Social History   Social History  . Marital status: Widowed    Spouse name: N/A  . Number of children: N/A  . Years of education: N/A   Occupational History  . Not on file.   Social History Main Topics  . Smoking status: Former Smoker    Packs/day: 0.50    Years: 20.00    Types: Cigarettes    Quit date: 22    . Smokeless tobacco: Never Used     Comment: Quit 20 years ago  . Alcohol use No  . Drug use: No  . Sexual activity: No   Other Topics Concern  . Not on file   Social History Narrative   Independent, lives at home by himself.    No family history on file.   Current Outpatient Prescriptions:  .  aspirin EC 81 MG tablet, Take 81 mg by mouth daily., Disp: , Rfl:  .  brimonidine (ALPHAGAN P) 0.1 % SOLN, Place 1 drop into both eyes 3 (three) times daily., Disp: , Rfl:  .  budesonide-formoterol (SYMBICORT) 160-4.5 MCG/ACT inhaler, Inhale 2 puffs into the lungs 2 (two) times daily., Disp: , Rfl:  .  celecoxib (CELEBREX) 200 MG capsule, Take 200 mg by mouth daily., Disp: , Rfl:  .  diclofenac sodium (VOLTAREN) 1 % GEL, Apply 2 g topically 4 (four) times daily as needed (for pain)., Disp: , Rfl:  .  donepezil (ARICEPT) 10 MG tablet, , Disp: , Rfl:  .  dorzolamide-timolol (COSOPT) 22.3-6.8 MG/ML ophthalmic solution, Place 1 drop into both eyes 2 (two) times daily., Disp: , Rfl:  .  fluticasone (FLONASE) 50 MCG/ACT nasal spray, Place 2 sprays into both nostrils daily., Disp: , Rfl:  .  furosemide (LASIX) 20 MG tablet, Take 20 mg by mouth daily., Disp: , Rfl:  .  latanoprost (XALATAN) 0.005 % ophthalmic solution, Place 1 drop into both eyes at bedtime., Disp: , Rfl:  .  montelukast (SINGULAIR) 10 MG tablet, Take 10 mg by mouth daily., Disp: , Rfl:  .  omeprazole (PRILOSEC) 20 MG capsule, Take 40 mg by mouth daily before breakfast., Disp: , Rfl:  .  pantoprazole (PROTONIX) 40 MG tablet, , Disp: , Rfl:  .  potassium chloride (K-DUR) 10 MEQ tablet, Take 10 mEq by mouth daily., Disp: , Rfl:  .  pravastatin (PRAVACHOL) 40 MG tablet, Take 40 mg by mouth at bedtime., Disp: , Rfl:  .  zolpidem (AMBIEN) 5 MG tablet, Take 1 tablet by mouth at bedtime as needed [for insomina], Disp: , Rfl:   Physical exam:  Vitals:   05/09/17 1524  BP: 132/73  Pulse: 65  Resp: 18  Temp: (!) 97.4 F (36.3 C)   TempSrc: Tympanic  Weight: 170 lb (77.1 kg)   Physical Exam  Constitutional: He is oriented to person, place, and time and well-developed, well-nourished, and in no distress.  HENT:  Head: Normocephalic and atraumatic.  Eyes: Pupils are equal, round, and reactive to light. EOM are normal.  Neck: Normal range of motion.  Cardiovascular: Normal rate, regular rhythm and normal heart sounds.   Pulmonary/Chest: Effort normal and breath sounds normal.  Abdominal: Soft. Bowel sounds are normal.  Neurological: He is alert and oriented to person, place, and time.  Skin: Skin is warm and dry.     CMP Latest Ref Rng & Units 01/24/2017  Glucose 65 - 99 mg/dL 161(W)  BUN 6 - 20 mg/dL 15  Creatinine 0.61 - 1.24 mg/dL 1.611.06  Sodium 096135 - 045145 mmol/L 139  Potassium 3.5 - 5.1 mmol/L 3.8  Chloride 101 - 111 mmol/L 107  CO2 22 - 32 mmol/L 27  Calcium 8.9 - 10.3 mg/dL 9.3  Total Protein 6.5 - 8.1 g/dL 7.6  Total Bilirubin 0.3 - 1.2 mg/dL 0.5  Alkaline Phos 38 - 126 U/L 60  AST 15 - 41 U/L 24  ALT 17 - 63 U/L 15(L)   CBC Latest Ref Rng & Units 05/09/2017  WBC 3.8 - 10.6 K/uL 5.2  Hemoglobin 13.0 - 18.0 g/dL 12.7(L)  Hematocrit 40.0 - 52.0 % 40.0  Platelets 150 - 440 K/uL 106(L)      Assessment and plan- Patient is a 83 y.o. male referred for weight loss and elevated CEA  Cbc shows microcytosis without overt anemia. Ferritin mildly elevated. Iron studies show mild iron deficiency. Recent ct abdomen from April 2018 showed no malignancy. I have again encouraged patient to discuss possible colonoscopy with [facility]. Will also notify PCP. Will check hb electropheresis given microcytosis without anemia  Mild thrombocytopenia in 100's. Stable since 2013. Continue to monitor.  Weight loss- no ongoing weight loss. Stable since 4 months. Continue to monitor  rtc in 6 months     Visit Diagnosis 1. Microcytosis   2. Abnormal weight loss      Dr. Owens SharkArchana Myeasha Ballowe, MD, MPH [facility] Pager- [phone] 05/11/2017 8:12 AM

## 2017-05-12 ENCOUNTER — Telehealth: Payer: Self-pay | Admitting: *Deleted

## 2017-05-12 NOTE — Telephone Encounter (Signed)
-----   Message from Creig HinesArchana C Rao, MD sent at 05/12/2017  7:59 AM EDT ----- Please let patient know he has some component of iron deficiency. He should take po iron once a day. Also ask if he is agreeable to GI referral. [facility] I acn put in the referral. Thanks

## 2017-05-12 NOTE — Telephone Encounter (Signed)
Called and spoke to pt and explained the iron was low and Dr. Smith Robertao wanted him to try taking iron pill by mouth once a day.  He is agreeable and he put a family member on the phone to take down the ferrous sulfate name and get 325 mg and take once a day with food.  Also asked if he is willing to go [facility] and be seen because the levels are low. He is agreeable and I will make ref. On Monday and call pt.

## 2017-05-15 ENCOUNTER — Telehealth: Payer: Self-pay | Admitting: *Deleted

## 2017-05-15 NOTE — Telephone Encounter (Signed)
Called to let pt know that I got him a GI appt for 9/17 at 2 pm to see Tawni PummelJanice Woodard she is a PA for Dr. Marva PandaSkulskie. He is agreeable and new labs and notes sent to gi and pt got a family member to write his apt time and date down so he would not forget,

## 2017-06-16 IMAGING — CR DG CHEST 2V
1 series · 2 of 2 positions shown · non-contrast
Comparison: CT chest and PA and lateral chest 02/07/2012.

CLINICAL DATA: Left arm pain beginning yesterday. Diaphoresis. No
known injury. Initial encounter.

EXAM:
CHEST  2 VIEW

[Series 1: w chest pa · 0.14mm/px · 2 of 2 slices shown]
[im 1/2]
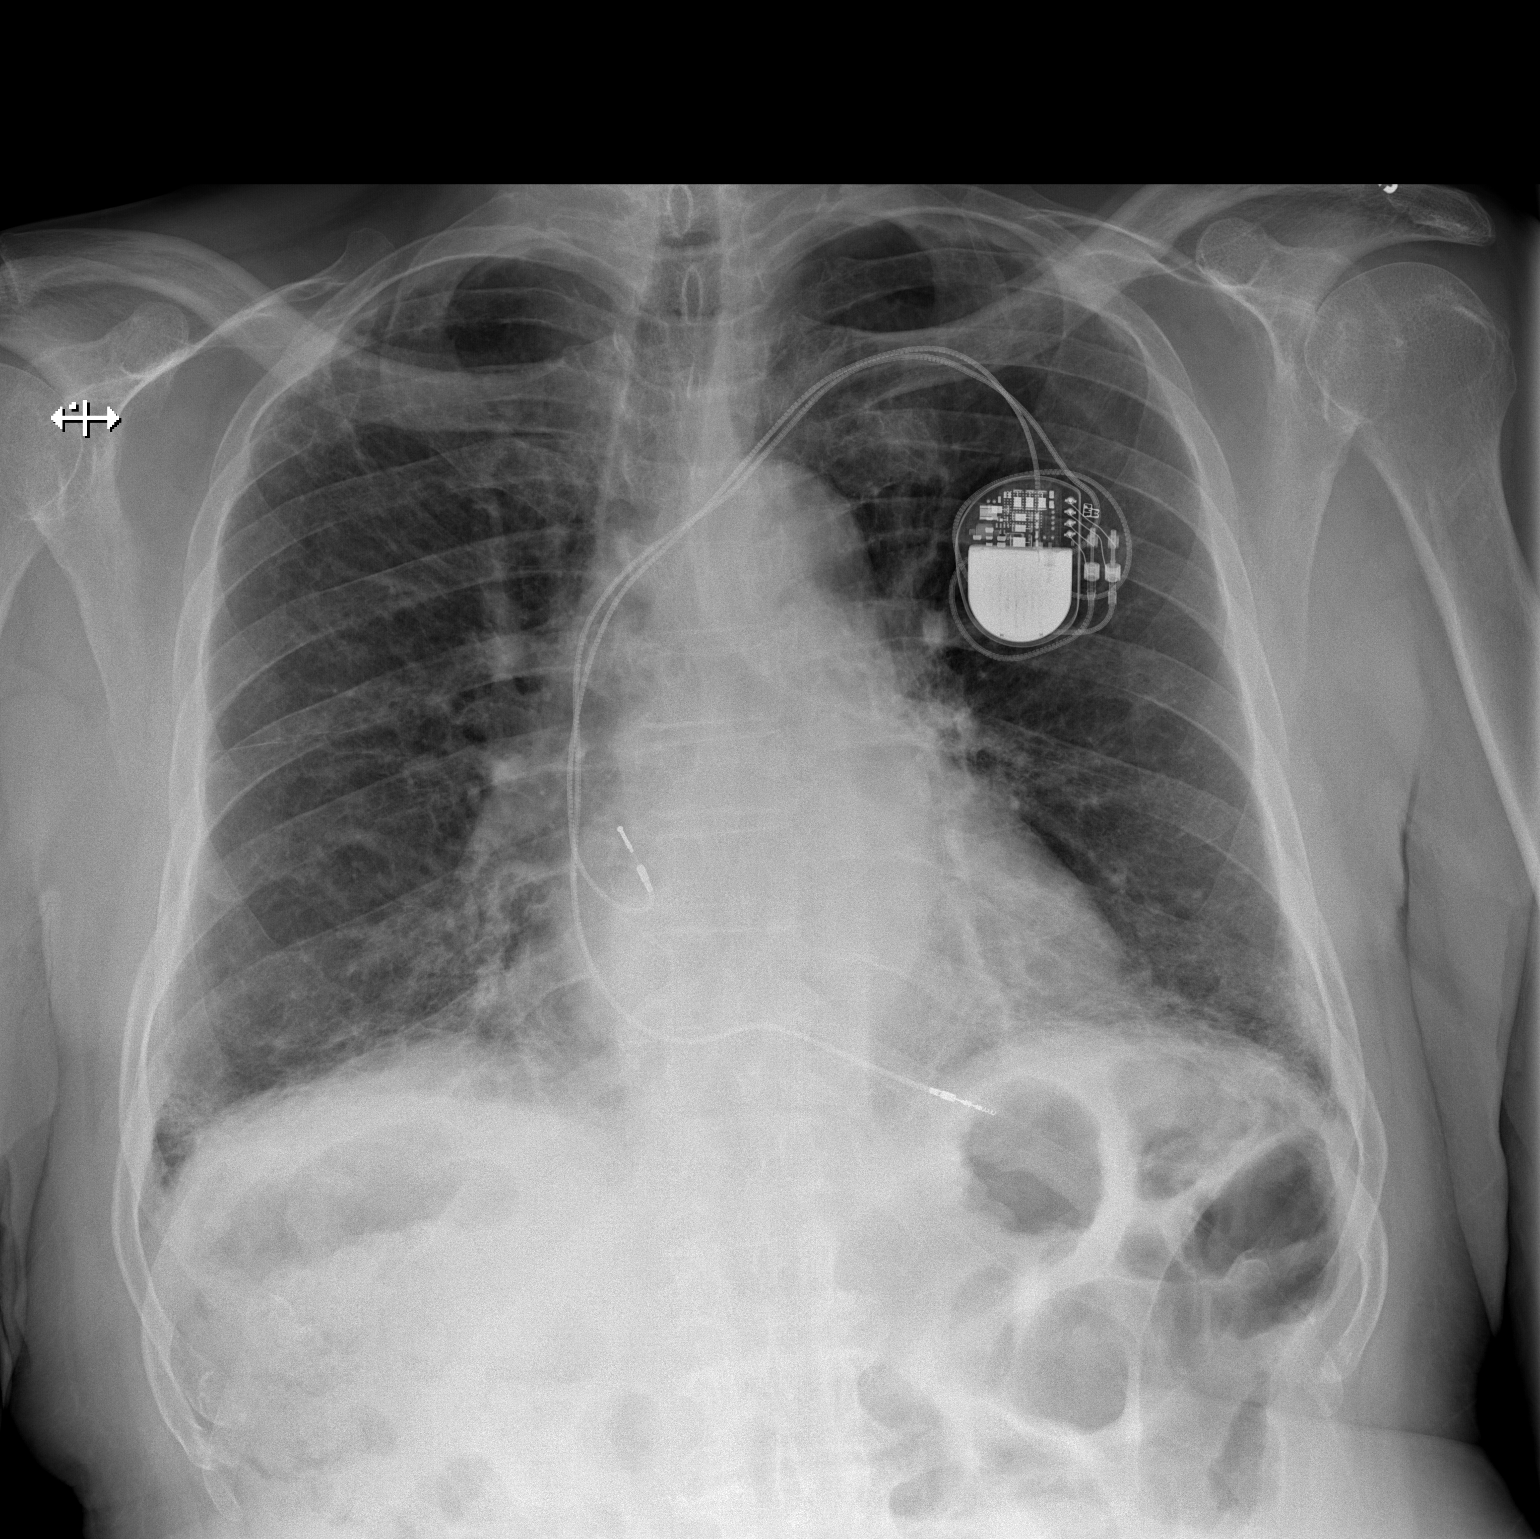
[im 2/2]
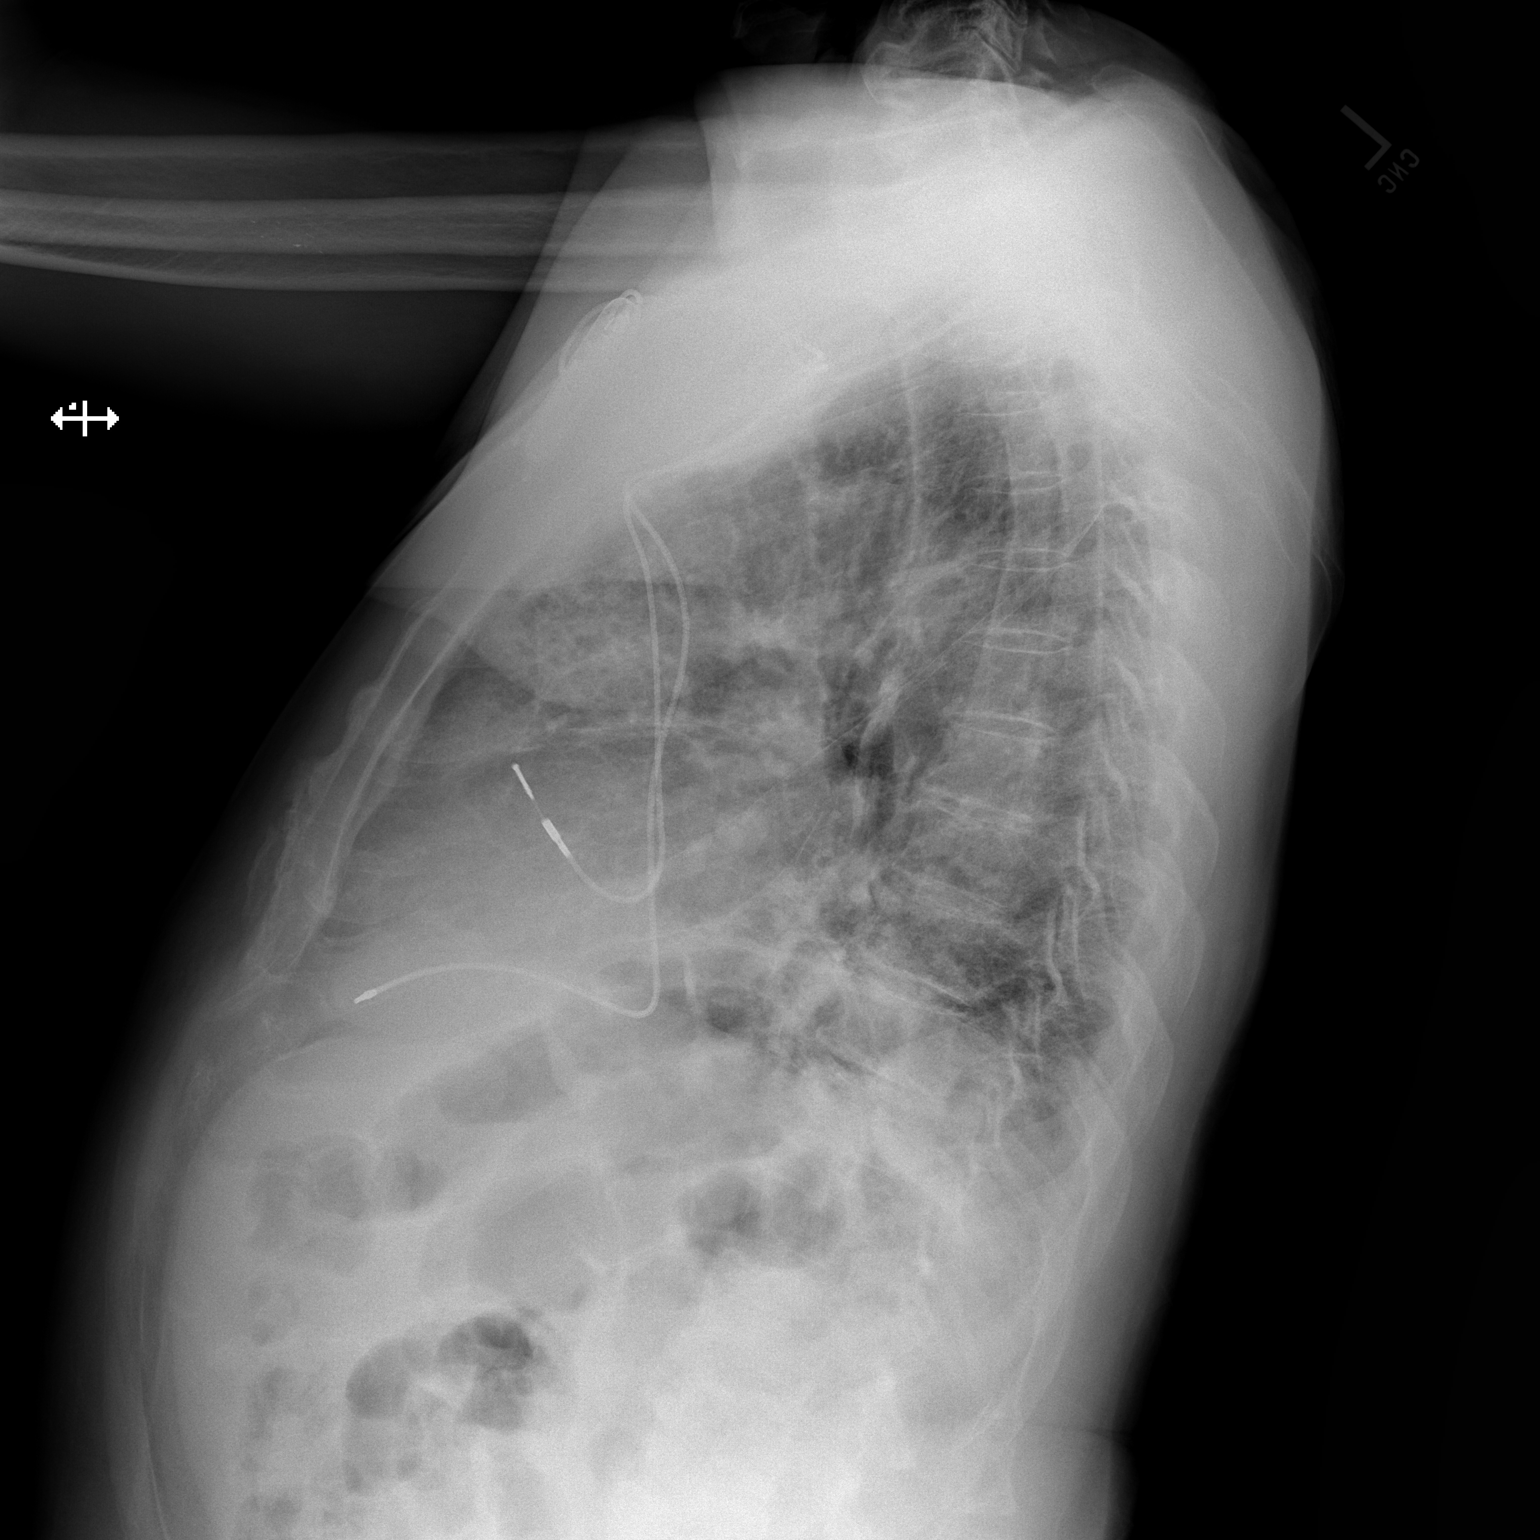

[2 of 2 positions shown; findings below may reference images not displayed]

FINDINGS: Pacing device is in place, unchanged. Heart size is normal.
Pulmonary fibrosis appearing worst in the lung bases has progressed
since the prior study. No consolidative process, pneumothorax or
effusion.
IMPRESSION: Progressed pulmonary fibrosis.  No acute disease.

## 2017-07-14 ENCOUNTER — Inpatient Hospital Stay
Admission: EM | Admit: 2017-07-14 | Discharge: 2017-07-18 | DRG: 291 | Disposition: A | Discharge status: Home / Self Care | Payer: Medicare HMO | Attending: Internal Medicine | Admitting: Internal Medicine

## 2017-07-14 ENCOUNTER — Encounter: Payer: Self-pay | Admitting: Emergency Medicine

## 2017-07-14 ENCOUNTER — Emergency Department: Payer: Medicare HMO

## 2017-07-14 DIAGNOSIS — Z950 Presence of cardiac pacemaker: Secondary | ICD-10-CM | POA: Diagnosis not present

## 2017-07-14 DIAGNOSIS — I2510 Atherosclerotic heart disease of native coronary artery without angina pectoris: Secondary | ICD-10-CM | POA: Diagnosis present

## 2017-07-14 DIAGNOSIS — N189 Chronic kidney disease, unspecified: Secondary | ICD-10-CM | POA: Diagnosis present

## 2017-07-14 DIAGNOSIS — N400 Enlarged prostate without lower urinary tract symptoms: Secondary | ICD-10-CM | POA: Diagnosis present

## 2017-07-14 DIAGNOSIS — M109 Gout, unspecified: Secondary | ICD-10-CM | POA: Diagnosis present

## 2017-07-14 DIAGNOSIS — Z7982 Long term (current) use of aspirin: Secondary | ICD-10-CM | POA: Diagnosis not present

## 2017-07-14 DIAGNOSIS — J189 Pneumonia, unspecified organism: Secondary | ICD-10-CM | POA: Diagnosis present

## 2017-07-14 DIAGNOSIS — I509 Heart failure, unspecified: Secondary | ICD-10-CM

## 2017-07-14 DIAGNOSIS — J40 Bronchitis, not specified as acute or chronic: Secondary | ICD-10-CM | POA: Diagnosis present

## 2017-07-14 DIAGNOSIS — Z87891 Personal history of nicotine dependence: Secondary | ICD-10-CM | POA: Diagnosis not present

## 2017-07-14 DIAGNOSIS — J8410 Pulmonary fibrosis, unspecified: Secondary | ICD-10-CM | POA: Diagnosis present

## 2017-07-14 DIAGNOSIS — R0602 Shortness of breath: Secondary | ICD-10-CM

## 2017-07-14 DIAGNOSIS — I248 Other forms of acute ischemic heart disease: Secondary | ICD-10-CM | POA: Diagnosis present

## 2017-07-14 DIAGNOSIS — I5033 Acute on chronic diastolic (congestive) heart failure: Secondary | ICD-10-CM | POA: Diagnosis present

## 2017-07-14 DIAGNOSIS — I130 Hypertensive heart and chronic kidney disease with heart failure and stage 1 through stage 4 chronic kidney disease, or unspecified chronic kidney disease: Principal | ICD-10-CM | POA: Diagnosis present

## 2017-07-14 DIAGNOSIS — J9601 Acute respiratory failure with hypoxia: Secondary | ICD-10-CM | POA: Diagnosis present

## 2017-07-14 DIAGNOSIS — Z79899 Other long term (current) drug therapy: Secondary | ICD-10-CM

## 2017-07-14 LAB — COMPREHENSIVE METABOLIC PANEL
ALT: 35 U/L (ref 17–63)
AST: 44 U/L — AB (ref 15–41)
Albumin: 2.9 g/dL — ABNORMAL LOW (ref 3.5–5.0)
Alkaline Phosphatase: 87 U/L (ref 38–126)
Anion gap: 10 (ref 5–15)
BUN: 11 mg/dL (ref 6–20)
CHLORIDE: 107 mmol/L (ref 101–111)
CO2: 20 mmol/L — AB (ref 22–32)
CREATININE: 0.97 mg/dL (ref 0.61–1.24)
Calcium: 8.7 mg/dL — ABNORMAL LOW (ref 8.9–10.3)
GFR calc non Af Amer: >60 mL/min (ref >60)
GLUCOSE: 142 mg/dL — AB (ref 65–99)
Potassium: 3.6 mmol/L (ref 3.5–5.1)
SODIUM: 137 mmol/L (ref 135–145)
Total Bilirubin: 1.3 mg/dL — ABNORMAL HIGH (ref 0.3–1.2)
Total Protein: 7.3 g/dL (ref 6.5–8.1)

## 2017-07-14 LAB — CBC
HCT: 41 % (ref 40.0–52.0)
HEMOGLOBIN: 13.1 g/dL (ref 13.0–18.0)
MCH: 22.9 pg — ABNORMAL LOW (ref 26.0–34.0)
MCHC: 31.9 g/dL — ABNORMAL LOW (ref 32.0–36.0)
MCV: 71.8 fL — ABNORMAL LOW (ref 80.0–100.0)
PLATELETS: 120 10*3/uL — AB (ref 150–440)
RBC: 5.7 MIL/uL (ref 4.40–5.90)
RDW: 14.9 % — ABNORMAL HIGH (ref 11.5–14.5)
WBC: 8.4 10*3/uL (ref 3.8–10.6)

## 2017-07-14 LAB — TROPONIN I
TROPONIN I: 0.16 ng/mL — AB (ref <0.03)
TROPONIN I: 0.18 ng/mL — AB (ref <0.03)
Troponin I: 0.19 ng/mL (ref <0.03)

## 2017-07-14 LAB — BRAIN NATRIURETIC PEPTIDE: B Natriuretic Peptide: 321 pg/mL — ABNORMAL HIGH (ref 0.0–100.0)

## 2017-07-14 MED ORDER — SODIUM CHLORIDE 0.9 % IV SOLN: 250.0000 mL | INTRAVENOUS | Status: DC | PRN

## 2017-07-14 MED ORDER — ONDANSETRON HCL 4 MG PO TABS: 4.0000 mg | ORAL_TABLET | Freq: Four times a day (QID) | ORAL | Status: DC | PRN

## 2017-07-14 MED ORDER — FUROSEMIDE 10 MG/ML IJ SOLN
60.0000 mg | Freq: Once | INTRAMUSCULAR | Status: AC
  Administered 2017-07-14: 60 mg via INTRAVENOUS
  Filled 2017-07-14: qty 8

## 2017-07-14 MED ORDER — SODIUM CHLORIDE 0.9% FLUSH
3.0000 mL | Freq: Two times a day (BID) | INTRAVENOUS | Status: DC
  Administered 2017-07-14 – 2017-07-17 (×3): 3 mL via INTRAVENOUS

## 2017-07-14 MED ORDER — POLYETHYLENE GLYCOL 3350 17 G PO PACK: 17.0000 g | PACK | Freq: Every day | ORAL | Status: DC | PRN

## 2017-07-14 MED ORDER — ONDANSETRON HCL 4 MG/2ML IJ SOLN: 4.0000 mg | Freq: Four times a day (QID) | INTRAMUSCULAR | Status: DC | PRN

## 2017-07-14 MED ORDER — ACETAMINOPHEN 325 MG PO TABS
650.0000 mg | ORAL_TABLET | Freq: Four times a day (QID) | ORAL | Status: DC | PRN
  Administered 2017-07-17: 650 mg via ORAL
  Filled 2017-07-14: qty 2

## 2017-07-14 MED ORDER — SODIUM CHLORIDE 0.9% FLUSH
3.0000 mL | Freq: Two times a day (BID) | INTRAVENOUS | Status: DC
  Administered 2017-07-14 – 2017-07-17 (×7): 3 mL via INTRAVENOUS

## 2017-07-14 MED ORDER — FUROSEMIDE 10 MG/ML IJ SOLN
INTRAMUSCULAR | Status: AC
  Administered 2017-07-14: 60 mg via INTRAVENOUS
  Filled 2017-07-14: qty 8

## 2017-07-14 MED ORDER — ALBUTEROL SULFATE (2.5 MG/3ML) 0.083% IN NEBU: 2.5000 mg | INHALATION_SOLUTION | RESPIRATORY_TRACT | Status: DC | PRN

## 2017-07-14 MED ORDER — ACETAMINOPHEN 650 MG RE SUPP: 650.0000 mg | Freq: Four times a day (QID) | RECTAL | Status: DC | PRN

## 2017-07-14 MED ORDER — ENOXAPARIN SODIUM 40 MG/0.4ML ~~LOC~~ SOLN
40.0000 mg | SUBCUTANEOUS | Status: DC
  Administered 2017-07-14 – 2017-07-17 (×4): 40 mg via SUBCUTANEOUS
  Filled 2017-07-14 (×4): qty 0.4

## 2017-07-14 MED ORDER — LEVOFLOXACIN 500 MG PO TABS
500.0000 mg | ORAL_TABLET | Freq: Every day | ORAL | Status: DC
  Administered 2017-07-15 – 2017-07-16 (×2): 500 mg via ORAL
  Filled 2017-07-14 (×2): qty 1

## 2017-07-14 MED ORDER — FUROSEMIDE 10 MG/ML IJ SOLN
60.0000 mg | Freq: Two times a day (BID) | INTRAMUSCULAR | Status: DC
  Administered 2017-07-14 – 2017-07-15 (×3): 60 mg via INTRAVENOUS
  Filled 2017-07-14 (×3): qty 6

## 2017-07-14 MED ORDER — SODIUM CHLORIDE 0.9% FLUSH: 3.0000 mL | INTRAVENOUS | Status: DC | PRN

## 2017-07-14 MED ORDER — LEVOFLOXACIN IN D5W 500 MG/100ML IV SOLN
500.0000 mg | Freq: Once | INTRAVENOUS | Status: AC
  Administered 2017-07-14: 500 mg via INTRAVENOUS
  Filled 2017-07-14: qty 100

## 2017-07-14 NOTE — ED Notes (Signed)
Date and time results received: 07/14/17 1210 (use smartphrase ".now" to insert current time)  Test: Tropin Critical Value: 0.16  Name of Provider Notified: Paduchowski  Orders Received? Or Actions Taken?: EMP notified and acknowledged lab value.

## 2017-07-14 NOTE — ED Triage Notes (Signed)
Pt reports that he has had some Shortness of breath that started yesterday. Per EMS he was 70% on room air, he was able to go up to the high 90's on 4L [place].

## 2017-07-14 NOTE — ED Notes (Signed)
Rn talked to EMP Paduchowski and he approved Levaquin beng administered before Blood cultures obtained due to LAB needing to draw [facility].   RN notified Lab of draw. 2 RN in ED attempted with out success.

## 2017-07-14 NOTE — H&P (Signed)
[facility] - [place] at [facility]   PATIENT NAME: Derek Bernard    MR#:  [id number]  DATE OF BIRTH:  Jun 24, 1930  DATE OF ADMISSION:  07/14/2017  PRIMARY CARE PHYSICIAN: System, Pcp Not In   REQUESTING/REFERRING PHYSICIAN: Dr. Lenard Lance  CHIEF COMPLAINT:   Chief Complaint  Patient presents with  . Shortness of Breath    HISTORY OF PRESENT ILLNESS:  Derek Bernard  is a 83 y.o. male with a known history of Pacemaker, hypertension, pulmonary fibrosis not on home oxygen presents to the emergency room complaining of one day of shortness of breath and orthopnea with yellow sputum. Patient does not smoke. Quit many years back. No sick contacts. Has lower extremity edema which is new. His ejection fraction55% from a cardiac catheterization from 2017. No significant occlusions.  PAST MEDICAL HISTORY:   Past Medical History:  Diagnosis Date  . Anemia   . BPH (benign prostatic hyperplasia)   . CKD (chronic kidney disease)   . Gout   . Hypertension   . Memory change   . Pacemaker 2010  . Pulmonary fibrosis (HCC)   . Sick sinus syndrome [facility])    s/p pacemaker  . Sinus bradycardia     PAST SURGICAL HISTORY:   Past Surgical History:  Procedure Laterality Date  . CARDIAC CATHETERIZATION N/A 12/23/2015   Procedure: Left Heart Cath and Coronary Angiography;  Surgeon: Lamar Blinks, MD;  Location: [facility] INVASIVE CV LAB;  Service: Cardiovascular;  Laterality: N/A;  . CATARACT EXTRACTION     Right eye  . PACEMAKER INSERTION      SOCIAL HISTORY:   Social History  Substance Use Topics  . Smoking status: Former Smoker    Packs/day: 0.50    Years: 20.00    Types: Cigarettes    Quit date: 7  . Smokeless tobacco: Never Used     Comment: Quit 20 years ago  . Alcohol use No    FAMILY HISTORY:  History reviewed. No pertinent family history.  DRUG ALLERGIES:  No Known Allergies  REVIEW OF SYSTEMS:   Review of Systems  Constitutional: Positive for  malaise/fatigue. Negative for chills, fever and weight loss.  HENT: Negative for hearing loss and nosebleeds.   Eyes: Negative for blurred vision, double vision and pain.  Respiratory: Positive for cough, sputum production and shortness of breath. Negative for hemoptysis and wheezing.   Cardiovascular: Positive for orthopnea and leg swelling. Negative for chest pain and palpitations.  Gastrointestinal: Negative for abdominal pain, constipation, diarrhea, nausea and vomiting.  Genitourinary: Negative for dysuria and hematuria.  Musculoskeletal: Negative for back pain, falls and myalgias.  Skin: Negative for rash.  Neurological: Positive for weakness. Negative for dizziness, tremors, sensory change, speech change, focal weakness, seizures and headaches.  Endo/Heme/Allergies: Does not bruise/bleed easily.  Psychiatric/Behavioral: Negative for depression and memory loss. The patient is not nervous/anxious.     MEDICATIONS AT HOME:   Prior to Admission medications   Medication Sig Start Date End Date Taking? Authorizing Provider  aspirin EC 81 MG tablet Take 81 mg by mouth daily.   Yes [provider]  atorvastatin (LIPITOR) 10 MG tablet Take 10 mg by mouth daily.   Yes [provider]  brimonidine (ALPHAGAN P) 0.1 % SOLN Place 1 drop into both eyes 3 (three) times daily.   Yes [provider]  budesonide-formoterol (SYMBICORT) 160-4.5 MCG/ACT inhaler Inhale 2 puffs into the lungs 2 (two) times daily.   Yes [provider]  celecoxib (CELEBREX)  200 MG capsule Take 200 mg by mouth daily.   Yes [provider]  dorzolamide-timolol (COSOPT) 22.3-6.8 MG/ML ophthalmic solution Place 1 drop into both eyes 2 (two) times daily.   Yes [provider]  fluticasone (FLONASE) 50 MCG/ACT nasal spray Place 2 sprays into both nostrils daily.   Yes [provider]  furosemide (LASIX) 20 MG tablet Take 20 mg by mouth daily.   Yes [provider]  isoniazid (NYDRAZID) 300 MG tablet Take by mouth daily.   Yes [provider]  latanoprost (XALATAN) 0.005 % ophthalmic solution Place 1 drop into both eyes at bedtime.   Yes [provider]  montelukast (SINGULAIR) 10 MG tablet Take 10 mg by mouth daily.   Yes [provider]  omeprazole (PRILOSEC) 20 MG capsule Take 40 mg by mouth daily before breakfast.   Yes [provider]  potassium chloride (K-DUR) 10 MEQ tablet Take 10 mEq by mouth daily.   Yes [provider]  pravastatin (PRAVACHOL) 40 MG tablet Take 40 mg by mouth at bedtime.   Yes [provider]  zolpidem (AMBIEN) 5 MG tablet Take 1 tablet by mouth at bedtime as needed [for insomina]    [provider]     VITAL SIGNS:  Blood pressure (!) 150/57, pulse 62, temperature 98.1 F (36.7 C), temperature source Oral, resp. rate 20, height  (1.727 m), weight 77.6 kg (171 lb), SpO2 96 %.  PHYSICAL EXAMINATION:  Physical Exam  GENERAL:  83 y.o.-year-old patient lying in the bed with no acute distress.  EYES: Pupils equal, round, reactive to light and accommodation. No scleral icterus. Extraocular muscles intact.  HEENT: Head atraumatic, normocephalic. Oropharynx and nasopharynx clear. No oropharyngeal erythema, moist oral mucosa  NECK:  Supple, no jugular venous distention. No thyroid enlargement, no tenderness.  LUNGS: Normal breath sounds bilaterally, no wheezing, rales, rhonchi. No use of accessory muscles of respiration.  CARDIOVASCULAR: S1, S2 normal. No murmurs, rubs, or gallops.  ABDOMEN: Soft, nontender, nondistended. Bowel sounds present. No organomegaly or mass.  EXTREMITIES: No pedal edema, cyanosis, or clubbing. + 2 pedal & radial pulses b/l.   NEUROLOGIC: Cranial nerves II through XII are intact. No focal Motor or sensory deficits appreciated b/l PSYCHIATRIC: The patient is alert and oriented x 3. Good affect.  SKIN: No obvious rash, lesion, or ulcer.    LABORATORY PANEL:   CBC  Recent Labs Lab 07/14/17 1116  WBC 8.4  HGB 13.1  HCT 41.0  PLT 120*   ------------------------------------------------------------------------------------------------------------------  Chemistries   Recent Labs Lab 07/14/17 1116  NA 137  K 3.6  CL 107  CO2 20*  GLUCOSE 142*  BUN 11  CREATININE 0.97  CALCIUM 8.7*  AST 44*  ALT 35  ALKPHOS 87  BILITOT 1.3*   ------------------------------------------------------------------------------------------------------------------  Cardiac Enzymes  Recent Labs Lab 07/14/17 1116  TROPONINI 0.16*   ------------------------------------------------------------------------------------------------------------------  RADIOLOGY:  Dg Chest 2 View  Result Date: 07/14/2017 CLINICAL DATA:  Pt reports that he has had some Shortness of breath that started yesterday. Per EMS he was 70% on room air, he was able to go up to the high 90's on 4L [place]. EXAM: CHEST  2 VIEW COMPARISON:  04/23/2016 CT and 12/22/2015 chest x-ray FINDINGS: Left-sided transvenous pacemaker with leads to the right atrium and right ventricle. The heart size is normal. There are patchy infiltrates within the lungs bilaterally, right greater than left and increased since prior study and compatible with asymmetric pulmonary edema or  infectious infiltrates. IMPRESSION: Increased diffuse asymmetric infiltrates are compatible with asymmetric pulmonary edema or infectious cause. Electronically Signed   By: Norva Pavlov M.D.   On: 07/14/2017 10:10     IMPRESSION AND PLAN:   * Acute chf, likely diastolic With acute hypoxic respiratory failure - IV Lasix - Input and Output - Counseled to limit fluids and Salt - Monitor Bun/Cr and Potassium - Echo ordered -Cardiology consult  * Bilateral pneumonitis. Start Levaquin. Afebrile.  * Elevated troponin likely due to congestive heart failure. No MI on EKG. No chest pain. We will repeat troponin.  If there is significant increase will need to start heparin for non-ST elevation MI. Only mild CAD on cardiac catheterization in 2017  * Hypertension. Continue home medications.  * DVT prophylaxis. Lovenox.  All the records are reviewed and case discussed with ED provider. Management plans discussed with the patient, family and they are in agreement.  CODE STATUS: FULL CODE  TOTAL TIME TAKING CARE OF THIS PATIENT: 40 minutes.   Milagros Loll R M.D on 07/14/2017 at 12:45 PM  Between 7am to 6pm - Pager - [phone]  After 6pm go to [email or web address] - password [email or web address]  [facility] [place] [facility]  Office  [phone]  CC: Primary care physician; System, Pcp Not In  Note: This dictation was prepared with Dragon dictation along with smaller phrase technology. Any transcriptional errors that result from this process are unintentional.

## 2017-07-14 NOTE — ED Notes (Signed)
PT IV came out. Attempted IV stick, vein blew.

## 2017-07-14 NOTE — ED Notes (Signed)
Pt resting in bed, aware of pending admission, family at bedside 

## 2017-07-14 NOTE — ED Notes (Signed)
Admitting at bedside 

## 2017-07-14 NOTE — ED Provider Notes (Signed)
[facility] Emergency Department Provider Note  Time seen: 9:30 AM  I have reviewed the triage vital signs and the nursing notes.   HISTORY  Chief Complaint Shortness of Breath    HPI KEYONDRE HEPBURN is a 83 y.o. male With a past medical history of anemia, CK D, hypertension, sick sinus syndrome status post pacemaker, presents to the emergency department with difficulty breathing. According to the patient for the past 2-3 days he has been feeling short of breath, much worse with exertion. Patient denies any chest pain. He does state his legs have been more swollen over the past several days as well. States her shortness of breath is mild to moderate at rest and significant with exertion. No home O2 requirement. Patient called EMS today for difficulty breathing. EMS state room air saturation of 75% upon arrival. Patient is currently on oxygen in the emergency department with a saturation around 90% on 3 L. Patient states occasional cough. Denies sputum production. Denies fever. Denies nausea vomiting, overall largely negative review of systems.  Past Medical History:  Diagnosis Date  . Anemia   . BPH (benign prostatic hyperplasia)   . CKD (chronic kidney disease)   . Gout   . Hypertension   . Memory change   . Pacemaker 2010  . Pulmonary fibrosis (HCC)   . Sick sinus syndrome [facility])    s/p pacemaker  . Sinus bradycardia     Patient Active Problem List   Diagnosis Date Noted  . Cardiac pacemaker 07/04/2016  . Tendinitis 12/23/2015  . SSS (sick sinus syndrome) (HCC) 06/25/2015  . COPD (chronic obstructive pulmonary disease) (HCC) 03/28/2014  . Pulmonary fibrosis (HCC) 03/28/2014    Past Surgical History:  Procedure Laterality Date  . CARDIAC CATHETERIZATION N/A 12/23/2015   Procedure: Left Heart Cath and Coronary Angiography;  Surgeon: Lamar Blinks, MD;  Location: [facility] INVASIVE CV LAB;  Service: Cardiovascular;  Laterality: N/A;  . CATARACT  EXTRACTION     Right eye  . PACEMAKER INSERTION      Prior to Admission medications   Medication Sig Start Date End Date Taking? Authorizing Provider  aspirin EC 81 MG tablet Take 81 mg by mouth daily.    [provider]  brimonidine (ALPHAGAN P) 0.1 % SOLN Place 1 drop into both eyes 3 (three) times daily.    [provider]  budesonide-formoterol (SYMBICORT) 160-4.5 MCG/ACT inhaler Inhale 2 puffs into the lungs 2 (two) times daily.    [provider]  celecoxib (CELEBREX) 200 MG capsule Take 200 mg by mouth daily.    [provider]  diclofenac sodium (VOLTAREN) 1 % GEL Apply 2 g topically 4 (four) times daily as needed (for pain).    [provider]  donepezil (ARICEPT) 10 MG tablet  12/23/16   [provider]  dorzolamide-timolol (COSOPT) 22.3-6.8 MG/ML ophthalmic solution Place 1 drop into both eyes 2 (two) times daily.    [provider]  fluticasone (FLONASE) 50 MCG/ACT nasal spray Place 2 sprays into both nostrils daily.    [provider]  furosemide (LASIX) 20 MG tablet Take 20 mg by mouth daily.    [provider]  latanoprost (XALATAN) 0.005 % ophthalmic solution Place 1 drop into both eyes at bedtime.    [provider]  montelukast (SINGULAIR) 10 MG tablet Take 10 mg by mouth daily.    [provider]  omeprazole (PRILOSEC) 20 MG capsule Take 40 mg by mouth daily before breakfast.  [provider]  pantoprazole (PROTONIX) 40 MG tablet  01/04/17   [provider]  potassium chloride (K-DUR) 10 MEQ tablet Take 10 mEq by mouth daily.    [provider]  pravastatin (PRAVACHOL) 40 MG tablet Take 40 mg by mouth at bedtime.    [provider]  zolpidem (AMBIEN) 5 MG tablet Take 1 tablet by mouth at bedtime as needed [for insomina]    [provider]    No Known Allergies  No family history on file.  Social History Social History   Substance Use Topics  . Smoking status: Former Smoker    Packs/day: 0.50    Years: 20.00    Types: Cigarettes    Quit date: 71  . Smokeless tobacco: Never Used     Comment: Quit 20 years ago  . Alcohol use No    Review of Systems Constitutional: Negative for fever. Cardiovascular: Negative for chest pain. Respiratory: positive for shortness of breath worse with exertion. Positive for cough. Gastrointestinal: negative for abdominal pain, nausea vomiting or diarrhea. Genitourinary: Negative for dysuria. Musculoskeletal: positive for worsening lower extremity edema Neurological: Negative for headache All other ROS negative  ____________________________________________   PHYSICAL EXAM:  VITAL SIGNS: ED Triage Vitals  Enc Vitals Group     BP 07/14/17 0927 (!) 147/68     Pulse --      Resp --      Temp 07/14/17 0927 98.1 F (36.7 C)     Temp Source 07/14/17 0927 Oral     SpO2 07/14/17 0927 94 %     Weight --      Height --      Head Circumference --      Peak Flow --      Pain Score 07/14/17 0928 2     Pain Loc --      Pain Edu? --      Excl. in GC? --     Constitutional: Alert and oriented. Well appearing and in no distress. Eyes: Normal exam ENT   Head: Normocephalic and atraumatic.   Mouth/Throat: Mucous membranes are moist. Cardiovascular: Normal rate, regular rhythm. No murmur Respiratory: mild tachypnea, but clear breath sounds with good air movement. No wheezes rales or rhonchi. Gastrointestinal: Soft and nontender. No distention.   Musculoskeletal: Nontender with normal range of motion in all extremities. mild lower extremity edema bilaterally. Nontender. Neurologic:  Normal speech and language. No gross focal neurologic deficits  Skin:  Skin is warm, dry and intact.  Psychiatric: Mood and affect are normal.   ____________________________________________    EKG  EKG shows an atrial paced rhythm at 70 bpm with a narrow QRS a normal axis,  largely normal intervals with nonspecific ST changes.  ____________________________________________    RADIOLOGY  IMPRESSION: Increased diffuse asymmetric infiltrates are compatible with asymmetric pulmonary edema or infectious cause. ____________________________________________   INITIAL IMPRESSION / ASSESSMENT AND PLAN / ED COURSE  Pertinent labs & imaging results that were available during my care of the patient were reviewed by me and considered in my medical decision making (see chart for details).  patient presents to the emergency department with shortness of breath, hypoxia, no O2 saturation at baseline. Differential this time would include CHF exacerbation, COPD exacerbation, pneumonia, pneumothorax, ACS. We will check labs including BNP and troponin. We'll obtain a chest x-ray. We will continue close monitor in the emergency department. Currently the patient appears well, no distress while on oxygen.  I reviewed the patient's records, largely  noncontributory to today's visit.  patient's chest x-ray shows asymmetric pulmonary edema versus pneumonia. We will cover with Levaquin as well as Lasix. Awaiting lab results before admission to the hospital.  labs show an elevated troponin. We will dose aspirin and admitted to the hospitalist service. ____________________________________________   FINAL CLINICAL IMPRESSION(S) / ED DIAGNOSES  dyspnea Hypoxia CHF   Minna Antis, MD 07/14/17 1220

## 2017-07-14 NOTE — ED Notes (Signed)
Pt informed of delay in admission. Pt moved back up in bed.

## 2017-07-15 ENCOUNTER — Inpatient Hospital Stay
Admit: 2017-07-15 | Discharge: 2017-07-15 | Disposition: A | Discharge status: Home / Self Care | Payer: Medicare HMO | Attending: Internal Medicine | Admitting: Internal Medicine

## 2017-07-15 LAB — CBC
HEMATOCRIT: 41.1 % (ref 40.0–52.0)
HEMOGLOBIN: 13.3 g/dL (ref 13.0–18.0)
MCH: 23.3 pg — ABNORMAL LOW (ref 26.0–34.0)
MCHC: 32.3 g/dL (ref 32.0–36.0)
MCV: 72.1 fL — ABNORMAL LOW (ref 80.0–100.0)
Platelets: 134 10*3/uL — ABNORMAL LOW (ref 150–440)
RBC: 5.7 MIL/uL (ref 4.40–5.90)
RDW: 15.1 % — ABNORMAL HIGH (ref 11.5–14.5)
WBC: 8.1 10*3/uL (ref 3.8–10.6)

## 2017-07-15 LAB — BASIC METABOLIC PANEL
ANION GAP: 9 (ref 5–15)
BUN: 16 mg/dL (ref 6–20)
CO2: 27 mmol/L (ref 22–32)
Calcium: 8.5 mg/dL — ABNORMAL LOW (ref 8.9–10.3)
Chloride: 103 mmol/L (ref 101–111)
Creatinine, Ser: 1.17 mg/dL (ref 0.61–1.24)
GFR calc Af Amer: >60 mL/min (ref >60)
GFR, EST NON AFRICAN AMERICAN: 55 mL/min — AB (ref >60)
GLUCOSE: 141 mg/dL — AB (ref 65–99)
POTASSIUM: 3 mmol/L — AB (ref 3.5–5.1)
SODIUM: 139 mmol/L (ref 135–145)

## 2017-07-15 MED ORDER — POTASSIUM CHLORIDE CRYS ER 20 MEQ PO TBCR
40.0000 meq | EXTENDED_RELEASE_TABLET | ORAL | Status: AC
  Administered 2017-07-15 (×2): 40 meq via ORAL
  Filled 2017-07-15 (×2): qty 2

## 2017-07-15 NOTE — Progress Notes (Signed)
[facility] - [place] at [facility]   PATIENT NAME: Derek Bernard    MR#:  [id number]  DATE OF BIRTH:  Feb 17, 1930  SUBJECTIVE:  CHIEF COMPLAINT:   Chief Complaint  Patient presents with  . Shortness of Breath   Shortness of breath is somewhat improved today. Continues to be on 4 L oxygen. Acutely ill. Conversational dyspnea.  REVIEW OF SYSTEMS:    Review of Systems  Constitutional: Positive for malaise/fatigue. Negative for chills and fever.  HENT: Negative for sore throat.   Eyes: Negative for blurred vision, double vision and pain.  Respiratory: Positive for cough and shortness of breath. Negative for hemoptysis and wheezing.   Cardiovascular: Negative for chest pain, palpitations and leg swelling.  Gastrointestinal: Negative for abdominal pain, constipation, diarrhea, heartburn, nausea and vomiting.  Genitourinary: Negative for dysuria and hematuria.  Musculoskeletal: Negative for back pain and joint pain.  Skin: Negative for rash.  Neurological: Positive for weakness. Negative for sensory change, speech change, focal weakness and headaches.  Endo/Heme/Allergies: Does not bruise/bleed easily.  Psychiatric/Behavioral: Negative for depression. The patient is not nervous/anxious.     DRUG ALLERGIES:  No Known Allergies  VITALS:  Blood pressure (!) 137/59, pulse 62, temperature 99.7 F (37.6 C), temperature source Oral, resp. rate (!) 33, height  (1.727 m), weight 73 kg (160 lb 14.4 oz), SpO2 96 %.  PHYSICAL EXAMINATION:   Physical Exam  GENERAL:  83 y.o.-year-old patient lying in the bed with no acute distress.  EYES: Pupils equal, round, reactive to light and accommodation. No scleral icterus. Extraocular muscles intact.  HEENT: Head atraumatic, normocephalic. Oropharynx and nasopharynx clear.  NECK:  Supple, no jugular venous distention. No thyroid enlargement, no tenderness.  LUNGS: bilateral crackles CARDIOVASCULAR: S1, S2 normal. No murmurs,  rubs, or gallops.  ABDOMEN: Soft, nontender, nondistended. Bowel sounds present. No organomegaly or mass.  EXTREMITIES: No cyanosis, clubbing or edema b/l.    NEUROLOGIC: Cranial nerves II through XII are intact. No focal Motor or sensory deficits b/l.   PSYCHIATRIC: The patient is alert and oriented x 3.  SKIN: No obvious rash, lesion, or ulcer.   LABORATORY PANEL:   CBC  Recent Labs Lab 07/15/17 0430  WBC 8.1  HGB 13.3  HCT 41.1  PLT 134*   ------------------------------------------------------------------------------------------------------------------ Chemistries   Recent Labs Lab 07/14/17 1116 07/15/17 0430  NA 137 139  K 3.6 3.0*  CL 107 103  CO2 20* 27  GLUCOSE 142* 141*  BUN 11 16  CREATININE 0.97 1.17  CALCIUM 8.7* 8.5*  AST 44*  --   ALT 35  --   ALKPHOS 87  --   BILITOT 1.3*  --    ------------------------------------------------------------------------------------------------------------------  Cardiac Enzymes  Recent Labs Lab 07/14/17 2142  TROPONINI 0.18*   ------------------------------------------------------------------------------------------------------------------  RADIOLOGY:  Dg Chest 2 View  Result Date: 07/14/2017 CLINICAL DATA:  Pt reports that he has had some Shortness of breath that started yesterday. Per EMS he was 70% on room air, he was able to go up to the high 90's on 4L [place]. EXAM: CHEST  2 VIEW COMPARISON:  04/23/2016 CT and 12/22/2015 chest x-ray FINDINGS: Left-sided transvenous pacemaker with leads to the right atrium and right ventricle. The heart size is normal. There are patchy infiltrates within the lungs bilaterally, right greater than left and increased since prior study and compatible with asymmetric pulmonary edema or infectious infiltrates. IMPRESSION: Increased diffuse asymmetric infiltrates are compatible with asymmetric pulmonary edema or infectious cause. Electronically Signed  By: Norva Pavlov M.D.   On:  07/14/2017 10:10     ASSESSMENT AND PLAN:   * Acute chf, likely diastolic With acute hypoxic respiratory failure - IV Lasix. -1200 ML - Input and Output - Counseled to limit fluids and Salt - Monitor Bun/Cr and Potassium - Echo ordered. pending - discussed with Dr. Romeo Apple of cardiology.  * Bilateral pneumonitis. Started Levaquin. Afebrile.  * Elevated troponin  due to congestive heart failure. No MI on EKG. No chest pain.  Repeat troponin is stable. Only mild CAD on cardiac catheterization in 2017  * Hypertension. Continue home medications.  * DVT prophylaxis. Lovenox.  All the records are reviewed and case discussed with Care Management/Social Workerr. Management plans discussed with the patient, family and they are in agreement.  CODE STATUS: FULL CODE  DVT Prophylaxis: SCDs  TOTAL TIME TAKING CARE OF THIS PATIENT: 35 minutes.   POSSIBLE D/C IN 1-2 DAYS, DEPENDING ON CLINICAL CONDITION.  Milagros Loll R M.D on 07/15/2017 at 12:14 PM  Between 7am to 6pm - Pager - [phone]  After 6pm go to [email or web address] - password [email or web address]  [facility] [place] [facility]  Office  [phone]  CC: Primary care physician; System, Pcp Not In  Note: This dictation was prepared with Dragon dictation along with smaller phrase technology. Any transcriptional errors that result from this process are unintentional.

## 2017-07-15 NOTE — Consult Note (Signed)
Derek Bernard is a 83 y.o. male  [id number]  Primary Cardiologist: Adrian Blackwater Reason for Consultation: Shortness of breath and CHF  HPI: This is a 83 year old African-American male with a history of CHF mild coronary artery disease status post pacemaker for sick sinus syndrome presented to the hospital with shortness of breath orthopnea PND and leg swelling. This pain going on for couple of days and got Lasix in the hospital and feels much better   Review of Systems: No chest pain   Past Medical History:  Diagnosis Date  . Anemia   . BPH (benign prostatic hyperplasia)   . CKD (chronic kidney disease)   . Gout   . Hypertension   . Memory change   . Pacemaker 2010  . Pulmonary fibrosis (HCC)   . Sick sinus syndrome [facility]    s/p pacemaker  . Sinus bradycardia     Medications Prior to Admission  Medication Sig Dispense Refill  . aspirin EC 81 MG tablet Take 81 mg by mouth daily.    Marland Kitchen atorvastatin (LIPITOR) 10 MG tablet Take 10 mg by mouth daily.    . brimonidine (ALPHAGAN P) 0.1 % SOLN Place 1 drop into both eyes 3 (three) times daily.    . budesonide-formoterol (SYMBICORT) 160-4.5 MCG/ACT inhaler Inhale 2 puffs into the lungs 2 (two) times daily.    . celecoxib (CELEBREX) 200 MG capsule Take 200 mg by mouth daily.    . dorzolamide-timolol (COSOPT) 22.3-6.8 MG/ML ophthalmic solution Place 1 drop into both eyes 2 (two) times daily.    . fluticasone (FLONASE) 50 MCG/ACT nasal spray Place 2 sprays into both nostrils daily.    . furosemide (LASIX) 20 MG tablet Take 20 mg by mouth daily.    Marland Kitchen isoniazid (NYDRAZID) 300 MG tablet Take by mouth daily.    Marland Kitchen latanoprost (XALATAN) 0.005 % ophthalmic solution Place 1 drop into both eyes at bedtime.    . montelukast (SINGULAIR) 10 MG tablet Take 10 mg by mouth daily.    Marland Kitchen omeprazole (PRILOSEC) 20 MG capsule Take 40 mg by mouth daily before breakfast.    . potassium chloride (K-DUR) 10 MEQ tablet Take 10 mEq by mouth daily.    .  pravastatin (PRAVACHOL) 40 MG tablet Take 40 mg by mouth at bedtime.    Marland Kitchen zolpidem (AMBIEN) 5 MG tablet Take 1 tablet by mouth at bedtime as needed [for insomina]       . enoxaparin (LOVENOX) injection  40 mg Subcutaneous Q24H  . furosemide  60 mg Intravenous BID  . levofloxacin  500 mg Oral Daily  . potassium chloride  40 mEq Oral Q4H  . sodium chloride flush  3 mL Intravenous Q12H  . sodium chloride flush  3 mL Intravenous Q12H    Infusions: . sodium chloride      No Known Allergies  Social History   Social History  . Marital status: Widowed    Spouse name: N/A  . Number of children: N/A  . Years of education: N/A   Occupational History  . Not on file.   Social History Main Topics  . Smoking status: Former Smoker    Packs/day: 0.50    Years: 20.00    Types: Cigarettes    Quit date: 11  . Smokeless tobacco: Never Used     Comment: Quit 20 years ago  . Alcohol use No  . Drug use: No  . Sexual activity: No   Other Topics Concern  . Not  on file   Social History Narrative   Independent, lives at home by himself.    History reviewed. No pertinent family history.  PHYSICAL EXAM: Vitals:   07/15/17 0450 07/15/17 0800  BP: (!) 143/61 (!) 137/59  Pulse: 60 62  Resp:    Temp: 99 F (37.2 C) 99.7 F (37.6 C)  SpO2: 97% 96%     Intake/Output Summary (Last 24 hours) at 07/15/17 0954 Last data filed at 07/15/17 0856  Gross per 24 hour  Intake                0 ml  Output             1400 ml  Net            -1400 ml    General:  Well appearing. No respiratory difficulty HEENT: normal Neck: supple. no JVD. Carotids 2+ bilat; no bruits. No lymphadenopathy or thryomegaly appreciated. Cor: PMI nondisplaced. Regular rate & rhythm. No rubs, gallops or murmurs. Lungs: clear Abdomen: soft, nontender, nondistended. No hepatosplenomegaly. No bruits or masses. Good bowel sounds. Extremities: no cyanosis, clubbing, rash, edema Neuro: alert & oriented x 3, cranial  nerves grossly intact. moves all 4 extremities w/o difficulty. Affect pleasant.  ECG: AV sequential paced rhythm 100%  Results for orders placed or performed during the hospital encounter of 07/14/17 (from the past 24 hour(s))  CBC     Status: Abnormal   Collection Time: 07/14/17 11:16 AM  Result Value Ref Range   WBC 8.4 3.8 - 10.6 K/uL   RBC 5.70 4.40 - 5.90 MIL/uL   Hemoglobin 13.1 13.0 - 18.0 g/dL   HCT 16.1 09.6 - 04.5 %   MCV 71.8 (L) 80.0 - 100.0 fL   MCH 22.9 (L) 26.0 - 34.0 pg   MCHC 31.9 (L) 32.0 - 36.0 g/dL   RDW 40.9 (H) 81.1 - 91.4 %   Platelets 120 (L) 150 - 440 K/uL  Comprehensive metabolic panel     Status: Abnormal   Collection Time: 07/14/17 11:16 AM  Result Value Ref Range   Sodium 137 135 - 145 mmol/L   Potassium 3.6 3.5 - 5.1 mmol/L   Chloride 107 101 - 111 mmol/L   CO2 20 (L) 22 - 32 mmol/L   Glucose, Bld 142 (H) 65 - 99 mg/dL   BUN 11 6 - 20 mg/dL   Creatinine, Ser 7.82 0.61 - 1.24 mg/dL   Calcium 8.7 (L) 8.9 - 10.3 mg/dL   Total Protein 7.3 6.5 - 8.1 g/dL   Albumin 2.9 (L) 3.5 - 5.0 g/dL   AST 44 (H) 15 - 41 U/L   ALT 35 17 - 63 U/L   Alkaline Phosphatase 87 38 - 126 U/L   Total Bilirubin 1.3 (H) 0.3 - 1.2 mg/dL   GFR calc non Af Amer >60 >60 mL/min   GFR calc Af Amer >60 >60 mL/min   Anion gap 10 5 - 15  Brain natriuretic peptide     Status: Abnormal   Collection Time: 07/14/17 11:16 AM  Result Value Ref Range   B Natriuretic Peptide 321.0 (H) 0.0 - 100.0 pg/mL  Troponin I     Status: Abnormal   Collection Time: 07/14/17 11:16 AM  Result Value Ref Range   Troponin I 0.16 (HH) <0.03 ng/mL  Blood culture (routine x 2)     Status: None (Preliminary result)   Collection Time: 07/14/17 11:59 AM  Result Value Ref Range   Specimen Description BLOOD  LEFT WRIST    Special Requests      BOTTLES DRAWN AEROBIC AND ANAEROBIC Blood Culture adequate volume   Culture NO GROWTH < 24 HOURS    Report Status PENDING   Blood culture (routine x 2)     Status:  None (Preliminary result)   Collection Time: 07/14/17 11:59 AM  Result Value Ref Range   Specimen Description BLOOD LEFT AC    Special Requests      BOTTLES DRAWN AEROBIC AND ANAEROBIC Blood Culture results may not be optimal due to an inadequate volume of blood received in culture bottles   Culture NO GROWTH < 24 HOURS    Report Status PENDING   Troponin I     Status: Abnormal   Collection Time: 07/14/17  3:42 PM  Result Value Ref Range   Troponin I 0.19 (HH) <0.03 ng/mL  Troponin I     Status: Abnormal   Collection Time: 07/14/17  9:42 PM  Result Value Ref Range   Troponin I 0.18 (HH) <0.03 ng/mL  Basic metabolic panel     Status: Abnormal   Collection Time: 07/15/17  4:30 AM  Result Value Ref Range   Sodium 139 135 - 145 mmol/L   Potassium 3.0 (L) 3.5 - 5.1 mmol/L   Chloride 103 101 - 111 mmol/L   CO2 27 22 - 32 mmol/L   Glucose, Bld 141 (H) 65 - 99 mg/dL   BUN 16 6 - 20 mg/dL   Creatinine, Ser 1.30 0.61 - 1.24 mg/dL   Calcium 8.5 (L) 8.9 - 10.3 mg/dL   GFR calc non Af Amer 55 (L) >60 mL/min   GFR calc Af Amer >60 >60 mL/min   Anion gap 9 5 - 15  CBC     Status: Abnormal   Collection Time: 07/15/17  4:30 AM  Result Value Ref Range   WBC 8.1 3.8 - 10.6 K/uL   RBC 5.70 4.40 - 5.90 MIL/uL   Hemoglobin 13.3 13.0 - 18.0 g/dL   HCT 86.5 78.4 - 69.6 %   MCV 72.1 (L) 80.0 - 100.0 fL   MCH 23.3 (L) 26.0 - 34.0 pg   MCHC 32.3 32.0 - 36.0 g/dL   RDW 29.5 (H) 28.4 - 13.2 %   Platelets 134 (L) 150 - 440 K/uL   Dg Chest 2 View  Result Date: 07/14/2017 CLINICAL DATA:  Pt reports that he has had some Shortness of breath that started yesterday. Per EMS he was 70% on room air, he was able to go up to the high 90's on 4L [place]. EXAM: CHEST  2 VIEW COMPARISON:  04/23/2016 CT and 12/22/2015 chest x-ray FINDINGS: Left-sided transvenous pacemaker with leads to the right atrium and right ventricle. The heart size is normal. There are patchy infiltrates within the lungs bilaterally, right  greater than left and increased since prior study and compatible with asymmetric pulmonary edema or infectious infiltrates. IMPRESSION: Increased diffuse asymmetric infiltrates are compatible with asymmetric pulmonary edema or infectious cause. Electronically Signed   By: Norva Pavlov M.D.   On: 07/14/2017 10:10     ASSESSMENT AND PLAN: Congestive heart failure most likely due to diastolic dysfunction with history of normal ejection fraction in the past with moderate CAD on cardiac catheter in the past. Patient has mildly elevated troponin but denies chest pain and was considered this as demand ischemia. Based on echocardiogram will decide further treatment.  KHAN,SHAUKAT A

## 2017-07-16 LAB — BASIC METABOLIC PANEL
ANION GAP: 12 (ref 5–15)
BUN: 27 mg/dL — AB (ref 6–20)
CALCIUM: 8.9 mg/dL (ref 8.9–10.3)
CO2: 24 mmol/L (ref 22–32)
Chloride: 102 mmol/L (ref 101–111)
Creatinine, Ser: 1.48 mg/dL — ABNORMAL HIGH (ref 0.61–1.24)
GFR calc Af Amer: 48 mL/min — ABNORMAL LOW (ref >60)
GFR, EST NON AFRICAN AMERICAN: 41 mL/min — AB (ref >60)
GLUCOSE: 115 mg/dL — AB (ref 65–99)
POTASSIUM: 3.5 mmol/L (ref 3.5–5.1)
SODIUM: 138 mmol/L (ref 135–145)

## 2017-07-16 LAB — ECHOCARDIOGRAM COMPLETE
Area-P 1/2: 2.08 cm2
CHL CUP MV DEC (S): 363
E decel time: 363 msec
FS: 43 % (ref 28–44)
HEIGHTINCHES: 68 in
IVS/LV PW RATIO, ED: 0.85
LA ID, A-P, ES: 45 mm
LA diam end sys: 45 mm
LA vol index: 33.3 mL/m2
LA vol: 62.6 mL
LADIAMINDEX: 2.4 cm/m2
LAVOLA4C: 51.6 mL
LV PW d: 11 mm — AB (ref 0.6–1.1)
Lateral S' vel: 13.9 cm/s
MV pk A vel: 96.5 m/s
MVPKEVEL: 53.8 m/s
P 1/2 time: 106 ms
RV TAPSE: 31.7 mm
WEIGHTICAEL: 2574.4 [oz_av]

## 2017-07-16 MED ORDER — BRIMONIDINE TARTRATE 0.15 % OP SOLN
1.0000 [drp] | Freq: Three times a day (TID) | OPHTHALMIC | Status: DC
  Administered 2017-07-17 (×2): 1 [drp] via OPHTHALMIC
  Filled 2017-07-16: qty 5

## 2017-07-16 MED ORDER — FUROSEMIDE 10 MG/ML IJ SOLN
60.0000 mg | Freq: Two times a day (BID) | INTRAMUSCULAR | Status: AC
  Administered 2017-07-16: 60 mg via INTRAVENOUS
  Filled 2017-07-16: qty 6

## 2017-07-16 MED ORDER — LEVOFLOXACIN 500 MG PO TABS
250.0000 mg | ORAL_TABLET | Freq: Every day | ORAL | Status: DC
  Administered 2017-07-17 – 2017-07-18 (×2): 250 mg via ORAL
  Filled 2017-07-16 (×2): qty 1

## 2017-07-16 MED ORDER — LATANOPROST 0.005 % OP SOLN
1.0000 [drp] | Freq: Every day | OPHTHALMIC | Status: DC
  Filled 2017-07-16: qty 2.5

## 2017-07-16 MED ORDER — DORZOLAMIDE HCL-TIMOLOL MAL 2-0.5 % OP SOLN
1.0000 [drp] | Freq: Two times a day (BID) | OPHTHALMIC | Status: DC
  Administered 2017-07-17 – 2017-07-18 (×2): 1 [drp] via OPHTHALMIC
  Filled 2017-07-16: qty 10

## 2017-07-16 NOTE — Progress Notes (Signed)
Pt sleeping with 2 l 02. sats 88%. Woke him up had him take some deeper breaths. sats on 2 l  Recovered to 92%. Taken for walk in hall 100 feet on 3 l. De sat to 84. Back in room at rest took 4 minutes to recover to 93%. Pt's gait slightly unsteady. He remains  Alert and oriented.

## 2017-07-16 NOTE — Progress Notes (Signed)
Patient begging for his eye drops. Stated, "I've not been receiving them since admitted." Dr. Angelina Ok notified and he initiated them from patient's home medications. Will implement orders as received and continue to monitor.

## 2017-07-16 NOTE — Progress Notes (Signed)
[facility] [place] [facility]   PATIENT NAME: Derek Bernard    MR#:  [id number]  DATE OF BIRTH:  1930/09/03  SUBJECTIVE:  CHIEF COMPLAINT:   Chief Complaint  Patient presents with  . Shortness of Breath   Feels better with his breathing. Still on 2 L oxygen. Afebrile. Occasional yellow sputum.  REVIEW OF SYSTEMS:    Review of Systems  Constitutional: Positive for malaise/fatigue. Negative for chills and fever.  HENT: Negative for sore throat.   Eyes: Negative for blurred vision, double vision and pain.  Respiratory: Positive for cough and shortness of breath. Negative for hemoptysis and wheezing.   Cardiovascular: Negative for chest pain, palpitations and leg swelling.  Gastrointestinal: Negative for abdominal pain, constipation, diarrhea, heartburn, nausea and vomiting.  Genitourinary: Negative for dysuria and hematuria.  Musculoskeletal: Negative for back pain and joint pain.  Skin: Negative for rash.  Neurological: Positive for weakness. Negative for sensory change, speech change, focal weakness and headaches.  Endo/Heme/Allergies: Does not bruise/bleed easily.  Psychiatric/Behavioral: Negative for depression. The patient is not nervous/anxious.     DRUG ALLERGIES:  No Known Allergies  VITALS:  Blood pressure (!) 126/59, pulse 63, temperature 99.5 F (37.5 C), temperature source Oral, resp. rate 16, height  (1.727 m), weight 73.2 kg (161 lb 4.8 oz), SpO2 92 %.  PHYSICAL EXAMINATION:   Physical Exam  GENERAL:  83 y.o.-year-old patient lying in the bed with no acute distress.  EYES: Pupils equal, round, reactive to light and accommodation. No scleral icterus. Extraocular muscles intact.  HEENT: Head atraumatic, normocephalic. Oropharynx and nasopharynx clear.  NECK:  Supple, no jugular venous distention. No thyroid enlargement, no tenderness.  LUNGS: bilateral crackles CARDIOVASCULAR: S1, S2 normal. No murmurs, rubs, or gallops.  ABDOMEN:  Soft, nontender, nondistended. Bowel sounds present. No organomegaly or mass.  EXTREMITIES: No cyanosis, clubbing or edema b/l.    NEUROLOGIC: Cranial nerves II through XII are intact. No focal Motor or sensory deficits b/l.   PSYCHIATRIC: The patient is alert and oriented x 3.  SKIN: No obvious rash, lesion, or ulcer.   LABORATORY PANEL:   CBC  Recent Labs Lab 07/15/17 0430  WBC 8.1  HGB 13.3  HCT 41.1  PLT 134*   ------------------------------------------------------------------------------------------------------------------ Chemistries   Recent Labs Lab 07/14/17 1116  07/16/17 0444  NA 137  < > 138  K 3.6  < > 3.5  CL 107  < > 102  CO2 20*  < > 24  GLUCOSE 142*  < > 115*  BUN 11  < > 27*  CREATININE 0.97  < > 1.48*  CALCIUM 8.7*  < > 8.9  AST 44*  --   --   ALT 35  --   --   ALKPHOS 87  --   --   BILITOT 1.3*  --   --   < > = values in this interval not displayed. ------------------------------------------------------------------------------------------------------------------  Cardiac Enzymes  Recent Labs Lab 07/14/17 2142  TROPONINI 0.18*   ------------------------------------------------------------------------------------------------------------------  RADIOLOGY:  No results found.   ASSESSMENT AND PLAN:   * Acute chf, likely diastolic With acute hypoxic respiratory failure - IV Lasix. His increasing creatinine Will change Lasix to once a day. - Input and Output - Counseled to limit fluids and Salt - Monitor Bun/Cr and Potassium - Echo ordered. Ejection fraction 65%. Diastolic dysfunction. - discussed with Dr. Welton Flakes of cardiology.  * Bilateral pneumonitis. Started Levaquin. Afebrile today  * Elevated troponin  due to  congestive heart failure. No MI on EKG. No chest pain.  Repeat troponin is stable. Only mild CAD on cardiac catheterization in 2017  * Hypertension. Continue home medications.  * DVT prophylaxis. Lovenox.  All the  records are reviewed and case discussed with Care Management/Social Workerr. Management plans discussed with the patient, family and they are in agreement.  CODE STATUS: FULL CODE  DVT Prophylaxis: SCDs  TOTAL TIME TAKING CARE OF THIS PATIENT: 35 minutes.   Patient is still needing oxygen at 2 L. Likely discharge tomorrow. Wean oxygen.  Milagros Loll R M.D on 07/16/2017 at 11:14 AM  Between 7am to 6pm - Pager - [phone]  After 6pm go to [email or web address] - password [email or web address]  [facility] [place] [facility]  Office  [phone]  CC: Primary care physician; System, Pcp Not In  Note: This dictation was prepared with Dragon dictation along with smaller phrase technology. Any transcriptional errors that result from this process are unintentional.

## 2017-07-16 NOTE — Progress Notes (Signed)
PHARMACY NOTE:  ANTIMICROBIAL RENAL DOSAGE ADJUSTMENT  Current antimicrobial regimen includes a mismatch between antimicrobial dosage and estimated renal function.  As per policy approved by the Pharmacy & Therapeutics and Medical Executive Committees, the antimicrobial dosage will be adjusted accordingly.  Current antimicrobial dosage:  Levofloxacin  PO Q24H  Indication: pneumonia  Renal Function:  Estimated Creatinine Clearance: 35.3 mL/min (A) (by C-G formula based on SCr of 1.48 mg/dL (H)).      On intermittent HD, scheduled:      On CRRT    Antimicrobial dosage has been changed to:  Levofloxacin  PO Q24H  Additional comments:   Thank you for allowing pharmacy to be a part of this patient's care.  Martyn Malay, [facility] 07/16/2017 1:54 PM

## 2017-07-16 NOTE — Progress Notes (Signed)
SUBJECTIVE: Patient is feeling much better less short of breath and no chest pain   Vitals:   07/15/17 1243 07/15/17 1942 07/16/17 0424 07/16/17 0800  BP: (!) 126/56 132/63 122/67 (!) 126/59  Pulse: 74 63 67 63  Resp: Temp: 99.3 F (37.4 C) 98 F (36.7 C) 98.1 F (36.7 C) 99.5 F (37.5 C)  TempSrc: Oral  Oral Oral  SpO2: 90% 90% 92% 92%  Weight:   161 lb 4.8 oz (73.2 kg)   Height:        Intake/Output Summary (Last 24 hours) at 07/16/17 1050 Last data filed at 07/16/17 1022  Gross per 24 hour  Intake              360 ml  Output              550 ml  Net             -190 ml    LABS: Basic Metabolic Panel:  Recent Labs  78/29/56 0430 07/16/17 0444  NA 139 138  K 3.0* 3.5  CL 103 102  CO2 27 24  GLUCOSE 141* 115*  BUN 16 27*  CREATININE 1.17 1.48*  CALCIUM 8.5* 8.9   Liver Function Tests:  Recent Labs  07/14/17 1116  AST 44*  ALT 35  ALKPHOS 87  BILITOT 1.3*  PROT 7.3  ALBUMIN 2.9*   No results for input(s): LIPASE, AMYLASE in the last 72 hours. CBC:  Recent Labs  07/14/17 1116 07/15/17 0430  WBC 8.4 8.1  HGB 13.1 13.3  HCT 41.0 41.1  MCV 71.8* 72.1*  PLT 120* 134*   Cardiac Enzymes:  Recent Labs  07/14/17 1116 07/14/17 1542 07/14/17 2142  TROPONINI 0.16* 0.19* 0.18*   BNP: Invalid input(s): POCBNP D-Dimer: No results for input(s): DDIMER in the last 72 hours. Hemoglobin A1C: No results for input(s): HGBA1C in the last 72 hours. Fasting Lipid Panel: No results for input(s): CHOL, HDL, LDLCALC, TRIG, CHOLHDL, LDLDIRECT in the last 72 hours. Thyroid Function Tests: No results for input(s): TSH, T4TOTAL, T3FREE, THYROIDAB in the last 72 hours.  Invalid input(s): FREET3 Anemia Panel: No results for input(s): VITAMINB12, FOLATE, FERRITIN, TIBC, IRON, RETICCTPCT in the last 72 hours.   PHYSICAL EXAM General: Well developed, well nourished, in no acute distress HEENT:  Normocephalic and atramatic Neck:  No JVD.  Lungs:  Clear bilaterally to auscultation and percussion. Heart: HRRR . Normal S1 and S2 without gallops or murmurs.  Abdomen: Bowel sounds are positive, abdomen soft and non-tender  Msk:  Back normal, normal gait. Normal strength and tone for age. Extremities: No clubbing, cyanosis or edema.   Neuro: Alert and oriented X 3. Psych:  Good affect, responds appropriately  TELEMETRY:AV sequential 100% paced rhythm  ASSESSMENT AND PLAN: Congestive heart failure due to diastolic dysfunction with normal left ventricular systolic function on echocardiogram. Patient is clinically getting much better and can be discharged tomorrow with follow-up in the office next Thursday at 10 AM.  Active Problems:   CHF (congestive heart failure) (HCC)    Domanik Rainville A, MD, [facility] 07/16/2017 10:50 AM

## 2017-07-17 ENCOUNTER — Encounter: Payer: Self-pay | Admitting: *Deleted

## 2017-07-17 MED ORDER — FUROSEMIDE 20 MG PO TABS
40.0000 mg | ORAL_TABLET | Freq: Every day | ORAL | 0 refills | Status: DC
Start: 2017-07-17 — End: 2017-08-11

## 2017-07-17 MED ORDER — LEVOFLOXACIN 250 MG PO TABS
250.0000 mg | ORAL_TABLET | Freq: Every day | ORAL | 0 refills | Status: DC
Start: 2017-07-18 — End: 2017-07-24

## 2017-07-17 NOTE — Care Management (Addendum)
Discharge to home today per Dr. Elpidio Anis. Lasiv IV has been given as part of Ms. Wach's treatment, but still needs oxygen to keep good saturations. [facility] will be supplying the home oxygen, A tank will have to be delivered from [facility], Nurse Shanda Bumps updated. Gwenette Greet RN MSN CCM Care Management [phone]

## 2017-07-17 NOTE — Care Management Note (Signed)
Case Management Note  Patient Details  Name: Derek Bernard MRN: [id number] Date of Birth: 1930-01-11  Subjective/Objective:     Admitted to [facility] with the diagnosis of CHF. Lives alone. Son is [name] [phone]). Sees Drs Marva Panda and Meredeth Ide. Prescriptions are filled at [facility]. Home Health a year ago doesn't remember name of agency. No skilled nursing. States he doesn't have oxygen in the home. The chart indicates acute O2. No medical equipment in the home. Takes care of all basic activities of daily living himself, drives.   No falls. Fair appetite.               Action/Plan: No follow-up needs at this time. Will continue to follow   Expected Discharge Date:  07/16/17               Expected Discharge Plan:     In-House Referral:     Discharge planning Services     Post Acute Care Choice:    Choice offered to:     DME Arranged:    DME Agency:     HH Arranged:    HH Agency:     Status of Service:     If discussed at [employer], dates discussed:    Additional Comments:  Gwenette Greet, RN MSN CCM Care Management [phone] 07/17/2017, 11:42 AM

## 2017-07-17 NOTE — Care Management Important Message (Signed)
Important Message  Patient Details  Name: Derek Bernard MRN: [id number] Date of Birth: 07-16-1930   Medicare Important Message Given:  Yes    Gwenette Greet, RN 07/17/2017, 9:40 AM

## 2017-07-17 NOTE — Progress Notes (Signed)
SUBJECTIVE: Pt is feeling well. No chest pain or shortness of breath. He states he wants to go home.  Vitals:   07/16/17 1134 07/16/17 2114 07/17/17 0513 07/17/17 0725  BP: (!) 123/55 (!) 117/54 (!) 118/55 (!) 117/54  Pulse: 64 73 62 69  Resp: Temp: 98.7 F (37.1 C) 99.4 F (37.4 C) 99.7 F (37.6 C)   TempSrc: Oral Oral Oral   SpO2: 95% 95% 94% 92%  Weight:   159 lb (72.1 kg)   Height:        Intake/Output Summary (Last 24 hours) at 07/17/17 0946 Last data filed at 07/17/17 0719  Gross per 24 hour  Intake              600 ml  Output             1100 ml  Net             -500 ml    LABS: Basic Metabolic Panel:  Recent Labs  16/10/96 0430 07/16/17 0444  NA 139 138  K 3.0* 3.5  CL 103 102  CO2 27 24  GLUCOSE 141* 115*  BUN 16 27*  CREATININE 1.17 1.48*  CALCIUM 8.5* 8.9   Liver Function Tests:  Recent Labs  07/14/17 1116  AST 44*  ALT 35  ALKPHOS 87  BILITOT 1.3*  PROT 7.3  ALBUMIN 2.9*   No results for input(s): LIPASE, AMYLASE in the last 72 hours. CBC:  Recent Labs  07/14/17 1116 07/15/17 0430  WBC 8.4 8.1  HGB 13.1 13.3  HCT 41.0 41.1  MCV 71.8* 72.1*  PLT 120* 134*   Cardiac Enzymes:  Recent Labs  07/14/17 1116 07/14/17 1542 07/14/17 2142  TROPONINI 0.16* 0.19* 0.18*   BNP: Invalid input(s): POCBNP D-Dimer: No results for input(s): DDIMER in the last 72 hours. Hemoglobin A1C: No results for input(s): HGBA1C in the last 72 hours. Fasting Lipid Panel: No results for input(s): CHOL, HDL, LDLCALC, TRIG, CHOLHDL, LDLDIRECT in the last 72 hours. Thyroid Function Tests: No results for input(s): TSH, T4TOTAL, T3FREE, THYROIDAB in the last 72 hours.  Invalid input(s): FREET3 Anemia Panel: No results for input(s): VITAMINB12, FOLATE, FERRITIN, TIBC, IRON, RETICCTPCT in the last 72 hours.   PHYSICAL EXAM General: Well developed, well nourished, in no acute distress HEENT:  Normocephalic and atramatic Neck:  No JVD.   Lungs: Mild inspiratory crackles bilaterally Heart: HRRR . Normal S1 and S2 without gallops or murmurs.  Abdomen: Bowel sounds are positive, abdomen soft and non-tender  Msk:  Back normal, normal gait. Normal strength and tone for age. Extremities: No clubbing, cyanosis or edema.   Neuro: Alert and oriented X 3. Psych:  Good affect, responds appropriately  TELEMETRY: NSR 87bpm, frequent PVCs  ASSESSMENT AND PLAN: Congestive heart failure secondary to diastolic dysfunction with normal left ventricular systolic function on echocardiogram. Patient is clinically improving and feeling well. Advise discharge today with close outpatient follow-up at [facility] with Dr. Welton Flakes Thursday at 10 AM.   Active Problems:   CHF (congestive heart failure) (HCC)    Derek Hamman, NP-C 07/17/2017 9:46 AM

## 2017-07-17 NOTE — Progress Notes (Signed)
SATURATION QUALIFICATIONS: (This note is used to comply with regulatory documentation for home oxygen)  Patient Saturations on Room Air at Rest = 87%  Patient Saturations on Room Air while Ambulating = %  Patient Saturations on  Liters of oxygen while Ambulating = %  Please briefly explain why patient needs home oxygen: 

## 2017-07-17 NOTE — Discharge Instructions (Signed)
Heart Failure Clinic appointment on July 24 2017 at 9:00am with Clarisa Kindred, FNP. Please call [phone] to reschedule.   Resume diet and activity as before

## 2017-07-17 NOTE — Consult Note (Addendum)
Provided patient with "Living Better with Heart Failure" packet. Briefly reviewed definition of heart failure and signs and symptoms of an exacerbation. Reviewed importance of and reason behind checking weight daily in the AM, after using the bathroom, but before getting dressed. Patient does not have a scale at home, but did state he could get one Discussed when to call the Dr= weight gain of >2lb overnight of 5lb in a week,  Discussed yellow zone= call MD: weight gain of >2lb overnight of 5lb in a week, increased swelling, increased SOB when lying down, chest discomfort, dizziness, increased fatigue Red Zone= call 911: struggle to breath, fainting or near fainting, significant chest pain Reviewed low sodium diet-provided handout of recommended and not recommended foods . Reviewed how to read nutrition label. Stay below 2g/day Keep fluid intake <2Lday Reviewed medications- furosemide, potassium, side effects, why we take them Discussed tobacco cessation: pt does not smoke  Patient appreciative of information. Packet given for his review and offered to return if he had any further questions  Olene Floss, Pharm.D, BCPS Clinical Pharmacist

## 2017-07-17 NOTE — Progress Notes (Signed)
Discharge instructions explained to pt and pts son/ verbalized an understanding/ iv and tele removed/ will transport off unit via wheelchair when o2 tank delivered.

## 2017-07-17 NOTE — Progress Notes (Signed)
This RN went to the patient room to administer the eye drops and patient indicated that he has already applied his home eye drops to his eyee. The RN asked the patient where he got those eye drop? Patient indicated they were in his bag. Apparently the nurse misunderstood the patient when he was begging for eye drop for his eyes. Patient indicated he was begging for me to get them from his bag. Hospital eye drops were returned to patient's bin since he had already taken his home ones. Patient had multiple pills in four different bottles, 5 different eye drops and an inhaler in his bag. Medications taken and  returned to the pharmacy for storage. Patient is not harmed. Will continue to  monitor.

## 2017-07-18 NOTE — Progress Notes (Signed)
Pt ended up not discharging last night due to [facility], not bringing oxygen until after midnight. Apparently [facility] had the delivery date wrong, they had the oxygen to be delivered 10/16, not 10/15. Family was updated, they are okay with pt staying the night and coming back to get him first thing this morning, oxygen is now here, so pt is ready to discharge whenever family gets here.

## 2017-07-18 NOTE — Progress Notes (Signed)
SUBJECTIVE: Patient is feeling less short of breath   Vitals:   07/17/17 1159 07/17/17 1959 07/18/17 0357 07/18/17 0744  BP: 116/63 (!) 111/48 (!) 125/57 128/65  Pulse: (!) 59 65 79 83  Resp: Temp: 98.3 F (36.8 C) 99.7 F (37.6 C) 97.9 F (36.6 C) 98.2 F (36.8 C)  TempSrc:  Oral Oral Oral  SpO2: 94% 94% 99% 100%  Weight:   159 lb 9.6 oz (72.4 kg)   Height:        Intake/Output Summary (Last 24 hours) at 07/18/17 0833 Last data filed at 07/18/17 0745  Gross per 24 hour  Intake              480 ml  Output              525 ml  Net              -45 ml    LABS: Basic Metabolic Panel:  Recent Labs  47/82/95 0444  NA 138  K 3.5  CL 102  CO2 24  GLUCOSE 115*  BUN 27*  CREATININE 1.48*  CALCIUM 8.9   Liver Function Tests: No results for input(s): AST, ALT, ALKPHOS, BILITOT, PROT, ALBUMIN in the last 72 hours. No results for input(s): LIPASE, AMYLASE in the last 72 hours. CBC: No results for input(s): WBC, NEUTROABS, HGB, HCT, MCV, PLT in the last 72 hours. Cardiac Enzymes: No results for input(s): CKTOTAL, CKMB, CKMBINDEX, TROPONINI in the last 72 hours. BNP: Invalid input(s): POCBNP D-Dimer: No results for input(s): DDIMER in the last 72 hours. Hemoglobin A1C: No results for input(s): HGBA1C in the last 72 hours. Fasting Lipid Panel: No results for input(s): CHOL, HDL, LDLCALC, TRIG, CHOLHDL, LDLDIRECT in the last 72 hours. Thyroid Function Tests: No results for input(s): TSH, T4TOTAL, T3FREE, THYROIDAB in the last 72 hours.  Invalid input(s): FREET3 Anemia Panel: No results for input(s): VITAMINB12, FOLATE, FERRITIN, TIBC, IRON, RETICCTPCT in the last 72 hours.   PHYSICAL EXAM General: Well developed, well nourished, in no acute distress HEENT:  Normocephalic and atramatic Neck:  No JVD.  Lungs: Clear bilaterally to auscultation and percussion. Heart: HRRR . Normal S1 and S2 without gallops or murmurs.  Abdomen: Bowel sounds are positive,  abdomen soft and non-tender  Msk:  Back normal, normal gait. Normal strength and tone for age. Extremities: No clubbing, cyanosis or edema.   Neuro: Alert and oriented X 3. Psych:  Good affect, responds appropriately  TELEMETRY:Sinus rhythm  ASSESSMENT AND PLAN: Congestive heart failure with diastolic dysfunction. Patient also had bronchitis with hypoxia. Cardiac point of view patient can be discharged with follow-up in the office next Monday at 10 AM.  Active Problems:   CHF (congestive heart failure) (HCC)    Laurier Nancy, MD, [facility] 07/18/2017 8:33 AM

## 2017-07-18 NOTE — Progress Notes (Addendum)
Derek Bernard to be D/C'd Home per MD order.  Discussed prescriptions and follow up appointments with the patient. Prescriptions given to patient, medication list explained in detail. Pt verbalized understanding. Explained how to work regulator to  pt and son Derek Bernard).  Allergies as of 07/18/2017   No Known Allergies     Medication List    TAKE these medications   aspirin EC 81 MG tablet Take 81 mg by mouth daily.   atorvastatin 10 MG tablet Commonly known as:  LIPITOR Take 10 mg by mouth daily.   brimonidine 0.1 % Soln Commonly known as:  ALPHAGAN P Place 1 drop into both eyes 3 (three) times daily.   budesonide-formoterol 160-4.5 MCG/ACT inhaler Commonly known as:  SYMBICORT Inhale 2 puffs into the lungs 2 (two) times daily.   celecoxib 200 MG capsule Commonly known as:  CELEBREX Take 200 mg by mouth daily.   dorzolamide-timolol 22.3-6.8 MG/ML ophthalmic solution Commonly known as:  COSOPT Place 1 drop into both eyes 2 (two) times daily.   fluticasone 50 MCG/ACT nasal spray Commonly known as:  FLONASE Place 2 sprays into both nostrils daily.   furosemide 20 MG tablet Commonly known as:  LASIX Take 2 tablets (40 mg total) by mouth daily. What changed:  how much to take   isoniazid 300 MG tablet Commonly known as:  NYDRAZID Take by mouth daily.   latanoprost 0.005 % ophthalmic solution Commonly known as:  XALATAN Place 1 drop into both eyes at bedtime.   levofloxacin 250 MG tablet Commonly known as:  LEVAQUIN Take 1 tablet (250 mg total) by mouth daily.   montelukast 10 MG tablet Commonly known as:  SINGULAIR Take 10 mg by mouth daily.   omeprazole 20 MG capsule Commonly known as:  PRILOSEC Take 40 mg by mouth daily before breakfast.   potassium chloride 10 MEQ tablet Commonly known as:  K-DUR Take 10 mEq by mouth daily.   pravastatin 40 MG tablet Commonly known as:  PRAVACHOL Take 40 mg by mouth at bedtime.   zolpidem 5 MG tablet Commonly  known as:  AMBIEN Take 1 tablet by mouth at bedtime as needed [for insomina]            Durable Medical Equipment        Start     Ordered   07/17/17 1323  For home use only DME oxygen  Once    Question Answer Comment  Mode or (Route) Nasal cannula   Liters per Minute 2   Frequency Continuous (stationary and portable oxygen unit needed)   Oxygen delivery system Gas      07/17/17 1322      Vitals:   07/18/17 0357 07/18/17 0744  BP: (!) 125/57 128/65  Pulse: 79 83  Resp: 17 18  Temp: 97.9 F (36.6 C) 98.2 F (36.8 C)  SpO2: 99% 100%    Skin clean, dry and intact without evidence of skin break down, no evidence of skin tears noted. IV catheter discontinued intact. Site without signs and symptoms of complications. Dressing and pressure applied. Pt denies pain at this time. No complaints noted.  An After Visit Summary was printed and given to the patient. Patient escorted via WC, and D/C home via private auto.  Derek Bernard

## 2017-07-18 NOTE — Progress Notes (Signed)
[employer] [facility] - [place] at [facility]   PATIENT NAME: Derek Bernard    MR#:  [id number]  DATE OF BIRTH:  09/07/1930  SUBJECTIVE:  CHIEF COMPLAINT:   Chief Complaint  Patient presents with  . Shortness of Breath   Slowly improving Sats 87% on RA and will need home O2  REVIEW OF SYSTEMS:    Review of Systems  Constitutional: Positive for malaise/fatigue. Negative for chills and fever.  HENT: Negative for sore throat.   Eyes: Negative for blurred vision, double vision and pain.  Respiratory: Positive for cough and shortness of breath. Negative for hemoptysis and wheezing.   Cardiovascular: Negative for chest pain, palpitations and leg swelling.  Gastrointestinal: Negative for abdominal pain, constipation, diarrhea, heartburn, nausea and vomiting.  Genitourinary: Negative for dysuria and hematuria.  Musculoskeletal: Negative for back pain and joint pain.  Skin: Negative for rash.  Neurological: Positive for weakness. Negative for sensory change, speech change, focal weakness and headaches.  Endo/Heme/Allergies: Does not bruise/bleed easily.  Psychiatric/Behavioral: Negative for depression. The patient is not nervous/anxious.     DRUG ALLERGIES:  No Known Allergies  VITALS:  Blood pressure 128/65, pulse 83, temperature 98.2 F (36.8 C), temperature source Oral, resp. rate 18, height  (1.727 m), weight 72.4 kg (159 lb 9.6 oz), SpO2 100 %.  PHYSICAL EXAMINATION:   Physical Exam  GENERAL:  83 y.o.-year-old patient lying in the bed with no acute distress.  EYES: Pupils equal, round, reactive to light and accommodation. No scleral icterus. Extraocular muscles intact.  HEENT: Head atraumatic, normocephalic. Oropharynx and nasopharynx clear.  NECK:  Supple, no jugular venous distention. No thyroid enlargement, no tenderness.  LUNGS: bilateral crackles CARDIOVASCULAR: S1, S2 normal. No murmurs, rubs, or gallops.  ABDOMEN: Soft, nontender, nondistended. Bowel  sounds present. No organomegaly or mass.  EXTREMITIES: No cyanosis, clubbing or edema b/l.    NEUROLOGIC: Cranial nerves II through XII are intact. No focal Motor or sensory deficits b/l.   PSYCHIATRIC: The patient is alert and oriented x 3.  SKIN: No obvious rash, lesion, or ulcer.   LABORATORY PANEL:   CBC  Recent Labs Lab 07/15/17 0430  WBC 8.1  HGB 13.3  HCT 41.1  PLT 134*   ------------------------------------------------------------------------------------------------------------------ Chemistries   Recent Labs Lab 07/14/17 1116  07/16/17 0444  NA 137  < > 138  K 3.6  < > 3.5  CL 107  < > 102  CO2 20*  < > 24  GLUCOSE 142*  < > 115*  BUN 11  < > 27*  CREATININE 0.97  < > 1.48*  CALCIUM 8.7*  < > 8.9  AST 44*  --   --   ALT 35  --   --   ALKPHOS 87  --   --   BILITOT 1.3*  --   --   < > = values in this interval not displayed. ------------------------------------------------------------------------------------------------------------------  Cardiac Enzymes  Recent Labs Lab 07/14/17 2142  TROPONINI 0.18*   ------------------------------------------------------------------------------------------------------------------  RADIOLOGY:  No results found.   ASSESSMENT AND PLAN:   * Acute chf, likely diastolic With acute hypoxic respiratory failure - IV Lasix. His increasing creatinine Will change Lasix to once a day. - Input and Output - Counseled to limit fluids and Salt - Monitor Bun/Cr and Potassium - Echo ordered. Ejection fraction 65%. Diastolic dysfunction. - discussed with Dr. Welton Flakes of cardiology.  * Bilateral pneumonitis. Started Levaquin. Afebrile today  * Elevated troponin  due to congestive heart failure. No  MI on EKG. No chest pain.  Repeat troponin is stable. Only mild CAD on cardiac catheterization in 2017  * Hypertension. Continue home medications.  * DVT prophylaxis. Lovenox.  All the records are reviewed and case discussed  with Care Management/Social Workerr. Management plans discussed with the patient, family and they are in agreement.  CODE STATUS: FULL CODE  DVT Prophylaxis: SCDs  TOTAL TIME TAKING CARE OF THIS PATIENT: 35 minutes.   Patient is still needing oxygen at 2 L.  Could not get oxygen setup. D/c when Riley Lam R M.D on 07/18/2017 at 11:21 AM  Between 7am to 6pm - Pager - [phone]  After 6pm go to [email or web address] - password [email or web address]  [facility] [place] [facility]  Office  [phone]  CC: Primary care physician; System, Pcp Not In  Note: This dictation was prepared with Dragon dictation along with smaller phrase technology. Any transcriptional errors that result from this process are unintentional.

## 2017-07-19 ENCOUNTER — Other Ambulatory Visit: Payer: Self-pay | Admitting: *Deleted

## 2017-07-19 LAB — CULTURE, BLOOD (ROUTINE X 2)
CULTURE: NO GROWTH
Culture: NO GROWTH
SPECIAL REQUESTS: ADEQUATE

## 2017-07-20 NOTE — Discharge Summary (Signed)
[employer] [facility] - [place] at [facility]   PATIENT NAME: Derek Bernard    MR#:  [id number]  DATE OF BIRTH:  08-Jan-1930  DATE OF ADMISSION:  07/14/2017 ADMITTING PHYSICIAN: Milagros Loll, MD  DATE OF DISCHARGE: 07/18/2017 11:45 AM  PRIMARY CARE PHYSICIAN: System, Pcp Not In   ADMISSION DIAGNOSIS:  SOB (shortness of breath) [R06.02] Community acquired pneumonia, unspecified laterality [J18.9] Acute on chronic congestive heart failure, unspecified heart failure type (HCC) [I50.9]  DISCHARGE DIAGNOSIS:  Active Problems:   CHF (congestive heart failure) (HCC)   SECONDARY DIAGNOSIS:   Past Medical History:  Diagnosis Date  . Anemia   . BPH (benign prostatic hyperplasia)   . CKD (chronic kidney disease)   . Gout   . Hypertension   . Memory change   . Pacemaker 2010  . Pulmonary fibrosis (HCC)   . Sick sinus syndrome [facility])    s/p pacemaker  . Sinus bradycardia      ADMITTING HISTORY  HISTORY OF PRESENT ILLNESS:  Derek Bernard  is a 83 y.o. male with a known history of Pacemaker, hypertension, pulmonary fibrosis not on home oxygen presents to the emergency room complaining of one day of shortness of breath and orthopnea with yellow sputum. Patient does not smoke. Quit many years back. No sick contacts. Has lower extremity edema which is new. His ejection fraction55% from a cardiac catheterization from 2017. No significant occlusions.   HOSPITAL COURSE:   * Acute on chronic diastolic congestive heart failure * Acute hypoxic respiratory failure * Bilateral pneumonitis * Mild elevation troponin due to congestive heart failure. Not MI. * Pulmonary fibrosis * Hypertension  Patient was admitted to telemetry floor with cardiac monitoring. Started on IV antibiotics and Lasix. He diuresed well during the hospital stay with significant improvement in breathing. Was seen by cardiology Dr. Welton Flakes. He was weaned off oxygen but continued to lead need 2 L oxygen on  ambulation. With improvement patient has been set up with home oxygen and discharged home to follow-up with his cardiologist and pulmonology in 1-2 weeks.  Stable for discharge home with oxygen.    CONSULTS OBTAINED:  Treatment Team:  Laurier Nancy, MD  DRUG ALLERGIES:  No Known Allergies  DISCHARGE MEDICATIONS:   Discharge Medication List as of 07/17/2017  5:51 PM    START taking these medications   Details  levofloxacin (LEVAQUIN) 250 MG tablet Take 1 tablet (250 mg total) by mouth daily., Starting Tue 07/18/2017, Normal      CONTINUE these medications which have CHANGED   Details  furosemide (LASIX) 20 MG tablet Take 2 tablets (40 mg total) by mouth daily., Starting Mon 07/17/2017, Normal      CONTINUE these medications which have NOT CHANGED   Details  aspirin EC 81 MG tablet Take 81 mg by mouth daily., Historical Med    atorvastatin (LIPITOR) 10 MG tablet Take 10 mg by mouth daily., Historical Med    brimonidine (ALPHAGAN P) 0.1 % SOLN Place 1 drop into both eyes 3 (three) times daily., Historical Med    budesonide-formoterol (SYMBICORT) 160-4.5 MCG/ACT inhaler Inhale 2 puffs into the lungs 2 (two) times daily., Historical Med    celecoxib (CELEBREX) 200 MG capsule Take 200 mg by mouth daily., Historical Med    dorzolamide-timolol (COSOPT) 22.3-6.8 MG/ML ophthalmic solution Place 1 drop into both eyes 2 (two) times daily., Historical Med    fluticasone (FLONASE) 50 MCG/ACT nasal spray Place 2 sprays into both nostrils daily., Historical Med  isoniazid (NYDRAZID) 300 MG tablet Take by mouth daily., Historical Med    latanoprost (XALATAN) 0.005 % ophthalmic solution Place 1 drop into both eyes at bedtime., Historical Med    montelukast (SINGULAIR) 10 MG tablet Take 10 mg by mouth daily., Historical Med    omeprazole (PRILOSEC) 20 MG capsule Take 40 mg by mouth daily before breakfast., Historical Med    potassium chloride (K-DUR) 10 MEQ tablet Take 10 mEq by  mouth daily., Historical Med    pravastatin (PRAVACHOL) 40 MG tablet Take 40 mg by mouth at bedtime., Historical Med    zolpidem (AMBIEN) 5 MG tablet Take 1 tablet by mouth at bedtime as needed [for insomina], Historical Med        Today   VITAL SIGNS:  Blood pressure 128/65, pulse 83, temperature 98.2 F (36.8 C), temperature source Oral, resp. rate 18, height 5\' 8"  (1.727 m), weight 72.4 kg (159 lb 9.6 oz), SpO2 100 %.  I/O:  No intake or output data in the 24 hours ending 07/20/17 1437  PHYSICAL EXAMINATION:  Physical Exam  GENERAL:  83 y.o.-year-old patient lying in the bed with no acute distress.  LUNGS: Normal breath sounds bilaterally, no wheezing, rales,rhonchi or crepitation. No use of accessory muscles of respiration.  CARDIOVASCULAR: S1, S2 normal. No murmurs, rubs, or gallops.  ABDOMEN: Soft, non-tender, non-distended. Bowel sounds present. No organomegaly or mass.  NEUROLOGIC: Moves all 4 extremities. PSYCHIATRIC: The patient is alert and oriented x 3.  SKIN: No obvious rash, lesion, or ulcer.   DATA REVIEW:   CBC  Recent Labs Lab 07/15/17 0430  WBC 8.1  HGB 13.3  HCT 41.1  PLT 134*    Chemistries   Recent Labs Lab 07/14/17 1116  07/16/17 0444  NA 137  < > 138  K 3.6  < > 3.5  CL 107  < > 102  CO2 20*  < > 24  GLUCOSE 142*  < > 115*  BUN 11  < > 27*  CREATININE 0.97  < > 1.48*  CALCIUM 8.7*  < > 8.9  AST 44*  --   --   ALT 35  --   --   ALKPHOS 87  --   --   BILITOT 1.3*  --   --   < > = values in this interval not displayed.  Cardiac Enzymes  Recent Labs Lab 07/14/17 2142  TROPONINI 0.18*    Microbiology Results  Results for orders placed or performed during the hospital encounter of 07/14/17  Blood culture (routine x 2)     Status: None   Collection Time: 07/14/17 11:59 AM  Result Value Ref Range Status   Specimen Description BLOOD LEFT WRIST  Final   Special Requests   Final    BOTTLES DRAWN AEROBIC AND ANAEROBIC Blood  Culture adequate volume   Culture NO GROWTH 5 DAYS  Final   Report Status 07/19/2017 FINAL  Final  Blood culture (routine x 2)     Status: None   Collection Time: 07/14/17 11:59 AM  Result Value Ref Range Status   Specimen Description BLOOD LEFT AC  Final   Special Requests   Final    BOTTLES DRAWN AEROBIC AND ANAEROBIC Blood Culture results may not be optimal due to an inadequate volume of blood received in culture bottles   Culture NO GROWTH 5 DAYS  Final   Report Status 07/19/2017 FINAL  Final    RADIOLOGY:  No results found.  Follow up with  PCP in 1 week.  Management plans discussed with the patient, family and they are in agreement.  CODE STATUS:  Code Status History    Date Active Date Inactive Code Status Order ID Comments User Context   07/14/2017 12:44 PM 07/18/2017  2:50 PM Full Code [id number]  Milagros Loll, MD ED   12/22/2015  8:33 PM 12/23/2015  8:19 PM Full Code [id number]  Enid Baas, MD Inpatient      TOTAL TIME TAKING CARE OF THIS PATIENT ON DAY OF DISCHARGE: more than 30 minutes.   Milagros Loll R M.D on 07/20/2017 at 2:37 PM  Between 7am to 6pm - Pager - [phone]  After 6pm go to [email or web address] - password [email or web address]  [facility] [place] [facility]  Office  [phone]  CC: Primary care physician; System, Pcp Not In  Note: This dictation was prepared with Dragon dictation along with smaller phrase technology. Any transcriptional errors that result from this process are unintentional.

## 2017-07-24 ENCOUNTER — Ambulatory Visit: Payer: Medicare HMO | Attending: Family | Admitting: Family

## 2017-07-24 ENCOUNTER — Encounter: Payer: Self-pay | Admitting: Family

## 2017-07-24 VITALS — BP 113/60 | HR 62 | Resp 20 | Ht 68.0 in | Wt 162.1 lb

## 2017-07-24 DIAGNOSIS — I130 Hypertensive heart and chronic kidney disease with heart failure and stage 1 through stage 4 chronic kidney disease, or unspecified chronic kidney disease: Secondary | ICD-10-CM | POA: Diagnosis present

## 2017-07-24 DIAGNOSIS — Z79899 Other long term (current) drug therapy: Secondary | ICD-10-CM | POA: Diagnosis not present

## 2017-07-24 DIAGNOSIS — Z950 Presence of cardiac pacemaker: Secondary | ICD-10-CM | POA: Diagnosis not present

## 2017-07-24 DIAGNOSIS — M109 Gout, unspecified: Secondary | ICD-10-CM | POA: Insufficient documentation

## 2017-07-24 DIAGNOSIS — Z7982 Long term (current) use of aspirin: Secondary | ICD-10-CM | POA: Diagnosis not present

## 2017-07-24 DIAGNOSIS — D649 Anemia, unspecified: Secondary | ICD-10-CM | POA: Insufficient documentation

## 2017-07-24 DIAGNOSIS — R0602 Shortness of breath: Secondary | ICD-10-CM | POA: Insufficient documentation

## 2017-07-24 DIAGNOSIS — N400 Enlarged prostate without lower urinary tract symptoms: Secondary | ICD-10-CM | POA: Diagnosis not present

## 2017-07-24 DIAGNOSIS — I2510 Atherosclerotic heart disease of native coronary artery without angina pectoris: Secondary | ICD-10-CM | POA: Insufficient documentation

## 2017-07-24 DIAGNOSIS — Z87891 Personal history of nicotine dependence: Secondary | ICD-10-CM | POA: Insufficient documentation

## 2017-07-24 DIAGNOSIS — I495 Sick sinus syndrome: Secondary | ICD-10-CM | POA: Diagnosis not present

## 2017-07-24 DIAGNOSIS — J8410 Pulmonary fibrosis, unspecified: Secondary | ICD-10-CM | POA: Insufficient documentation

## 2017-07-24 DIAGNOSIS — I5032 Chronic diastolic (congestive) heart failure: Secondary | ICD-10-CM | POA: Insufficient documentation

## 2017-07-24 DIAGNOSIS — I10 Essential (primary) hypertension: Secondary | ICD-10-CM | POA: Insufficient documentation

## 2017-07-24 DIAGNOSIS — N189 Chronic kidney disease, unspecified: Secondary | ICD-10-CM | POA: Insufficient documentation

## 2017-07-24 DIAGNOSIS — Z9889 Other specified postprocedural states: Secondary | ICD-10-CM | POA: Diagnosis not present

## 2017-07-24 NOTE — Progress Notes (Signed)
Patient ID: Derek Bernard, male    DOB: 1929/10/17, 83 y.o.   MRN: [id number]  HPI  Derek Bernard is an 83 y/o male with a history of anemia, BPH, CKD, gout, HTN, sick sinus syndrome (pacemaker), pulmonary fibrosis, remote tobacco use and chronic heart failure.   Echo report from 07/15/17 reviewed and showed an EF of 65% along with mild Derek/TR. He also has an elevated PA pressure of 51 mm Hg. Cardiac catheterization was done 12/23/15 which showed mild 3 vessel CAD.   He was admitted 07/14/17 due to pneumonia and heart failure. Cardiology consult was obtained. Initially given IV antibiotics and furosemide. Discharged home after 4 days.   He presents today for his initial visit with a chief complaint of moderate shortness of breath with minimal exertion. He describes this as chronic in nature having been present for several years. He has associated fatigue, rhinorrhea and edema. He denies any chest pain, palpitations, wheezing, dizziness or difficulty sleeping. Currently not weighing daily as he doesn't have any scales.   Past Medical History:  Diagnosis Date  . Anemia   . BPH (benign prostatic hyperplasia)   . CHF (congestive heart failure) (HCC)   . CKD (chronic kidney disease)   . Gout   . Hypertension   . Memory change   . Pacemaker 2010  . Pulmonary fibrosis (HCC)   . Sick sinus syndrome [facility])    s/p pacemaker  . Sinus bradycardia    Past Surgical History:  Procedure Laterality Date  . CARDIAC CATHETERIZATION N/A 12/23/2015   Procedure: Left Heart Cath and Coronary Angiography;  Surgeon: Lamar Blinks, MD;  Location: [facility] INVASIVE CV LAB;  Service: Cardiovascular;  Laterality: N/A;  . CATARACT EXTRACTION     Right eye  . PACEMAKER INSERTION     No family history on file. Social History  Substance Use Topics  . Smoking status: Former Smoker    Packs/day: 0.50    Years: 20.00    Types: Cigarettes    Quit date: 71  . Smokeless tobacco: Never Used     Comment: Quit 20  years ago  . Alcohol use No   No Known Allergies Prior to Admission medications   Medication Sig Start Date End Date Taking? Authorizing Provider  aspirin EC 81 MG tablet Take 81 mg by mouth daily.   Yes [provider]  atorvastatin (LIPITOR) 10 MG tablet Take 10 mg by mouth daily.   Yes [provider]  brimonidine (ALPHAGAN P) 0.1 % SOLN Place 1 drop into both eyes 3 (three) times daily.   Yes [provider]  budesonide-formoterol (SYMBICORT) 160-4.5 MCG/ACT inhaler Inhale 2 puffs into the lungs 2 (two) times daily.   Yes [provider]  celecoxib (CELEBREX) 200 MG capsule Take 200 mg by mouth daily.   Yes [provider]  dorzolamide-timolol (COSOPT) 22.3-6.8 MG/ML ophthalmic solution Place 1 drop into both eyes 2 (two) times daily.   Yes [provider]  fluticasone (FLONASE) 50 MCG/ACT nasal spray Place 2 sprays into both nostrils daily.   Yes [provider]  furosemide (LASIX) 20 MG tablet Take 2 tablets (40 mg total) by mouth daily. 07/17/17  Yes Sudini, Wardell Heath, MD  isoniazid (NYDRAZID) 300 MG tablet Take by mouth daily.   Yes [provider]  latanoprost (XALATAN) 0.005 % ophthalmic solution Place 1 drop into both eyes at bedtime.   Yes [provider]  montelukast (SINGULAIR) 10 MG tablet Take 10 mg by  mouth daily.   Yes [provider]  omeprazole (PRILOSEC) 20 MG capsule Take 40 mg by mouth daily before breakfast.   Yes [provider]  potassium chloride (K-DUR) 10 MEQ tablet Take 10 mEq by mouth daily.   Yes [provider]  pravastatin (PRAVACHOL) 40 MG tablet Take 40 mg by mouth at bedtime.   Yes [provider]  zolpidem (AMBIEN) 5 MG tablet Take 1 tablet by mouth at bedtime as needed [for insomina]   Yes [provider]    Review of Systems  Constitutional: Positive for appetite change (decreased) and fatigue.  HENT: Positive for hearing loss and  rhinorrhea. Negative for congestion and sore throat.   Eyes: Negative.   Respiratory: Positive for shortness of breath. Negative for chest tightness and wheezing.   Cardiovascular: Positive for leg swelling. Negative for chest pain and palpitations.  Gastrointestinal: Negative for abdominal distention and abdominal pain.  Endocrine: Negative.   Genitourinary: Negative.   Musculoskeletal: Negative for back pain and neck pain.  Skin: Negative.   Allergic/Immunologic: Negative.   Neurological: Negative for dizziness and light-headedness.  Hematological: Negative for adenopathy. Does not bruise/bleed easily.  Psychiatric/Behavioral: Negative for dysphoric mood and sleep disturbance (wearing oxygen @ 2L ). The patient is not nervous/anxious.    Vitals:   07/24/17 0903  BP: 113/60  Pulse: 62  Resp: 20  SpO2: 99%  Weight: 162 lb 2 oz (73.5 kg)  Height: 5\' 8"  (1.727 m)   Wt Readings from Last 3 Encounters:  07/24/17 162 lb 2 oz (73.5 kg)  07/18/17 159 lb 9.6 oz (72.4 kg)  05/09/17 170 lb (77.1 kg)   Lab Results  Component Value Date   CREATININE 1.48 (H) 07/16/2017   CREATININE 1.17 07/15/2017   CREATININE 0.97 07/14/2017   Physical Exam  Constitutional: He is oriented to person, place, and time. He appears well-developed and well-nourished.  HENT:  Head: Normocephalic and atraumatic.  Right Ear: Decreased hearing is noted.  Left Ear: Decreased hearing is noted.  Neck: Normal range of motion. Neck supple. No JVD present.  Cardiovascular: Regular rhythm.  Bradycardia present.   Pulmonary/Chest: Effort normal. He has no wheezes. He has no rales.  Abdominal: Soft. He exhibits no distension. There is no tenderness.  Musculoskeletal: He exhibits no edema or tenderness.  Neurological: He is alert and oriented to person, place, and time.  Skin: Skin is warm and dry.  Psychiatric: He has a normal mood and affect. His behavior is normal. Thought content normal.  Nursing note and vitals  reviewed.  Assessment & Plan:  1: Chronic heart failure with preserved ejection fraction- - NYHA class III - euvolemic today - not weighing daily as he doesn't have scales. Son that is with him says that he has a set at home that he'll give him. Instructed him to weigh every morning after using the bathroom, step on the scale, write it down and call for an overnight weight gain of >2 pounds or a weekly weight gain of >5 pounds - not adding salt and his son says that patient's significant other doesn't cook with salt. Discussed the importance of reading food labels so that he can maintain daily sodium intake to 2000mg  daily. Written dietary information was given to him about this - saw cardiologist Welton Flakes([place]) 07/21/17 - has already received his flu vaccine for this season - Pharm D went in and reviewed medications with the patient  2: HTN- - BP looks great today - BMP from  07/16/17 reviewed and showed sodium 138, potassium 3.5 and GFR 48  3: Pulmonary fibrosis- - wearing oxygen at 2L around the clock - saw pulmonologist Meredeth Ide) 12/10/15 - advised patient to speak with PCP regarding getting a follow-up appointment with Dr. Meredeth Ide  Patient did not bring his medications nor a list. Each medication was verbally reviewed with the patient and he was encouraged to bring the bottles to every visit to confirm accuracy of list. Does have 2 statin medications listed and they think he is taking atorvastatin.   Return in 1 month or sooner for any questions/problems before then.

## 2017-07-24 NOTE — Patient Instructions (Addendum)
Continue weighing daily and call for an overnight weight gain of > 2 pounds or a weekly weight gain of >5 pounds.  Drink between 40-60 ounces of fluid daily.  

## 2017-08-02 ENCOUNTER — Ambulatory Visit: Admission: RE | Admit: 2017-08-02 | Payer: Medicare HMO | Source: Ambulatory Visit | Admitting: Internal Medicine

## 2017-08-02 ENCOUNTER — Encounter: Admission: RE | Payer: Self-pay | Source: Ambulatory Visit

## 2017-08-02 SURGERY — ESOPHAGOGASTRODUODENOSCOPY (EGD) WITH PROPOFOL
Anesthesia: General

## 2017-08-04 ENCOUNTER — Telehealth: Payer: Self-pay

## 2017-08-04 NOTE — Telephone Encounter (Signed)
Pt daughter called to report patient is complaining of increased edema and shortness of breath.  He denies and change in weight as well as chest pain.   I spoke with Clarisa Kindredina Hackney FNP via telephone who advises that patient can take an extra furosemide if he is still experience edema tomorrow. She would also like patient to be seen in clinic next week to reevaluate.   Upon speaking to the daughter again she  States her fathers aide has only been giving him 20 mg of Furosemide daily instead of the 40 mg he was prescribed. Advised that we give him the 40 mg of Furosemide today and over the weekend and follow up in clinic on Monday 08/07/2017. Daughter verbalized understanding and will adjust medication accordingly.   Changes reviewed with Clarisa Kindredina Hackney FNP.

## 2017-08-07 ENCOUNTER — Other Ambulatory Visit: Payer: Self-pay

## 2017-08-07 ENCOUNTER — Encounter: Payer: Self-pay | Admitting: Family

## 2017-08-07 ENCOUNTER — Ambulatory Visit: Payer: Medicare HMO | Attending: Family | Admitting: Family

## 2017-08-07 VITALS — BP 109/57 | HR 68 | Resp 20 | Ht 68.0 in | Wt 157.1 lb

## 2017-08-07 DIAGNOSIS — Z79899 Other long term (current) drug therapy: Secondary | ICD-10-CM | POA: Insufficient documentation

## 2017-08-07 DIAGNOSIS — J8410 Pulmonary fibrosis, unspecified: Secondary | ICD-10-CM | POA: Diagnosis not present

## 2017-08-07 DIAGNOSIS — Z7982 Long term (current) use of aspirin: Secondary | ICD-10-CM | POA: Insufficient documentation

## 2017-08-07 DIAGNOSIS — Z9981 Dependence on supplemental oxygen: Secondary | ICD-10-CM | POA: Diagnosis not present

## 2017-08-07 DIAGNOSIS — N400 Enlarged prostate without lower urinary tract symptoms: Secondary | ICD-10-CM | POA: Insufficient documentation

## 2017-08-07 DIAGNOSIS — M109 Gout, unspecified: Secondary | ICD-10-CM | POA: Insufficient documentation

## 2017-08-07 DIAGNOSIS — Z950 Presence of cardiac pacemaker: Secondary | ICD-10-CM | POA: Insufficient documentation

## 2017-08-07 DIAGNOSIS — I130 Hypertensive heart and chronic kidney disease with heart failure and stage 1 through stage 4 chronic kidney disease, or unspecified chronic kidney disease: Secondary | ICD-10-CM | POA: Insufficient documentation

## 2017-08-07 DIAGNOSIS — I495 Sick sinus syndrome: Secondary | ICD-10-CM | POA: Diagnosis not present

## 2017-08-07 DIAGNOSIS — I10 Essential (primary) hypertension: Secondary | ICD-10-CM

## 2017-08-07 DIAGNOSIS — D649 Anemia, unspecified: Secondary | ICD-10-CM | POA: Insufficient documentation

## 2017-08-07 DIAGNOSIS — I5033 Acute on chronic diastolic (congestive) heart failure: Secondary | ICD-10-CM | POA: Diagnosis not present

## 2017-08-07 DIAGNOSIS — Z87891 Personal history of nicotine dependence: Secondary | ICD-10-CM | POA: Insufficient documentation

## 2017-08-07 NOTE — Patient Instructions (Addendum)
Continue weighing daily and call for an overnight weight gain of > 2 pounds or a weekly weight gain of >5 pounds.  For the next 3 days, increase your furosemide (lasix) to 40mg  twice daily. You will also increase your potassium to 10meq twice daily for these 3 days. After 3 days, you will go back down to your regular dose.  Will check lab work at your next visit.

## 2017-08-07 NOTE — Progress Notes (Signed)
Patient ID: Derek Bernard, male    DOB: 01/11/1930, 83 y.o.   MRN: [id number]  HPI  Derek Bernard is an 83 y/o male with a history of anemia, BPH, CKD, gout, HTN, sick sinus syndrome (pacemaker), pulmonary fibrosis, remote tobacco use and chronic heart failure.   Echo report from 07/15/17 reviewed and showed an EF of 65% along with mild Derek/TR. He also has an elevated PA pressure of 51 mm Hg. Cardiac catheterization was done 12/23/15 which showed mild 3 vessel CAD.   He was admitted 07/14/17 due to pneumonia and heart failure. Cardiology consult was obtained. Initially given IV antibiotics and furosemide. Discharged home after 4 days.   He presents today for a follow-up visit with a chief complaint of moderate shortness of breath with minimal exertion. He describes this as chronic in nature having been present for several years with varying levels of severity. He has associated fatigue and edema. He denies any chest pain, palpitations, dizziness, difficulty sleeping or weight gain.   Past Medical History:  Diagnosis Date  . Anemia   . BPH (benign prostatic hyperplasia)   . CHF (congestive heart failure) (HCC)   . CKD (chronic kidney disease)   . Gout   . Hypertension   . Memory change   . Pacemaker 2010  . Pulmonary fibrosis (HCC)   . Sick sinus syndrome [facility](HCC)    s/p pacemaker  . Sinus bradycardia    Past Surgical History:  Procedure Laterality Date  . CATARACT EXTRACTION     Right eye  . PACEMAKER INSERTION     No family history on file. Social History   Tobacco Use  . Smoking status: Former Smoker    Packs/day: 0.50    Years: 20.00    Pack years: 10.00    Types: Cigarettes    Last attempt to quit: 1985    Years since quitting: 33.8  . Smokeless tobacco: Never Used  . Tobacco comment: Quit 20 years ago  Substance Use Topics  . Alcohol use: No    Alcohol/week: 0.0 oz   No Known Allergies  Prior to Admission medications   Medication Sig Start Date End Date Taking?  Authorizing Provider  aspirin EC 81 MG tablet Take 81 mg by mouth daily.   Yes [provider]  atorvastatin (LIPITOR) 10 MG tablet Take 10 mg by mouth daily.   Yes [provider]  brimonidine (ALPHAGAN P) 0.1 % SOLN Place 1 drop into both eyes 3 (three) times daily.   Yes [provider]  budesonide-formoterol (SYMBICORT) 160-4.5 MCG/ACT inhaler Inhale 2 puffs into the lungs 2 (two) times daily.   Yes [provider]  celecoxib (CELEBREX) 200 MG capsule Take 200 mg by mouth daily.   Yes [provider]  dorzolamide-timolol (COSOPT) 22.3-6.8 MG/ML ophthalmic solution Place 1 drop into both eyes 2 (two) times daily.   Yes [provider]  fluticasone (FLONASE) 50 MCG/ACT nasal spray Place 2 sprays into both nostrils daily.   Yes [provider]  furosemide (LASIX) 20 MG tablet Take 2 tablets (40 mg total) by mouth daily. 07/17/17  Yes Sudini, Wardell HeathSrikar, MD  IRON PO Take 1 tablet daily by mouth.   Yes [provider]  isoniazid (NYDRAZID) 300 MG tablet Take by mouth daily.   Yes [provider]  latanoprost (XALATAN) 0.005 % ophthalmic solution Place 1 drop into both eyes at bedtime.   Yes [provider]  montelukast (SINGULAIR) 10 MG tablet Take 10  mg by mouth daily.   Yes [provider]  omeprazole (PRILOSEC) 20 MG capsule Take 40 mg by mouth daily before breakfast.   Yes [provider]  potassium chloride (K-DUR) 10 MEQ tablet Take 10 mEq by mouth daily.   Yes [provider]  zolpidem (AMBIEN) 5 MG tablet Take 1 tablet by mouth at bedtime as needed [for insomina]   Yes [provider]    Review of Systems  Constitutional: Positive for appetite change (decreased) and fatigue.  HENT: Positive for hearing loss. Negative for congestion and sore throat.   Eyes: Negative.   Respiratory: Positive for shortness of breath. Negative for chest tightness.   Cardiovascular:  Positive for leg swelling. Negative for chest pain and palpitations.  Gastrointestinal: Negative for abdominal distention and abdominal pain.  Endocrine: Negative.   Genitourinary: Negative.   Musculoskeletal: Negative for back pain and neck pain.  Skin: Negative.   Allergic/Immunologic: Negative.   Neurological: Negative for dizziness and light-headedness.  Hematological: Negative for adenopathy. Does not bruise/bleed easily.  Psychiatric/Behavioral: Negative for dysphoric mood and sleep disturbance (wearing oxygen @ 2L ). The patient is not nervous/anxious.    Vitals:   08/07/17 0912  BP: (!) 109/57  Pulse: 68  Resp: 20  SpO2: 95%  Weight: 157 lb 2 oz (71.3 kg)  Height: 5\' 8"  (1.727 m)   Wt Readings from Last 3 Encounters:  08/07/17 157 lb 2 oz (71.3 kg)  07/24/17 162 lb 2 oz (73.5 kg)  07/18/17 159 lb 9.6 oz (72.4 kg)   Lab Results  Component Value Date   CREATININE 1.48 (H) 07/16/2017   CREATININE 1.17 07/15/2017   CREATININE 0.97 07/14/2017   Physical Exam  Constitutional: He is oriented to person, place, and time. He appears well-developed and well-nourished.  HENT:  Head: Normocephalic and atraumatic.  Right Ear: Decreased hearing is noted.  Left Ear: Decreased hearing is noted.  Neck: Normal range of motion. Neck supple. No JVD present.  Cardiovascular: Normal rate and regular rhythm.  Pulmonary/Chest: Effort normal. He has no wheezes. He has no rales.  Abdominal: Soft. He exhibits no distension. There is no tenderness.  Musculoskeletal: He exhibits edema (1+ pitting edema in bilateral lower legs). He exhibits no tenderness.  Neurological: He is alert and oriented to person, place, and time.  Skin: Skin is warm and dry.  Psychiatric: He has a normal mood and affect. His behavior is normal. Thought content normal.  Nursing note and vitals reviewed.  Assessment & Plan:  1: Acute on Chronic heart failure with preserved ejection fraction- - NYHA class III -  mildly fluid overloaded today with worsening shortness of breath and edema - ReDS vest reading was 42% - weighing daily and says that his weight has fluctuated. Reminded to call for an overnight weight gain of >2 pounds or a weekly weight gain of >5 pounds - weight down 5 pounds by our scales since he was last here - not adding salt and his son says that patient's significant other doesn't cook with salt. Reviewed the importance of reading food labels so that he can maintain daily sodium intake to 2000mg  daily. - saw cardiologist Welton Flakes) 07/21/17 - has already received his flu vaccine for this season - will increase his furosemide to 40mg  BID for the next 3 days. Increase his potassium to BID for these next 3 days as well. - will check a BMP when he returns in 4 days  2: HTN- - BP looks great  today - BMP from 07/16/17 reviewed and showed sodium 138, potassium 3.5 and GFR 48  3: Pulmonary fibrosis- - wearing oxygen at 2L around the clock and can increase it to 3L if needed - saw pulmonologist Meredeth Ide) 12/10/15 - advised patient & son to call Dr. Reita Cliche office to get an appointment scheduled.  Patient did not bring his medications nor a list. Each medication was verbally reviewed with the patient and he was encouraged to bring the bottles to every visit to confirm accuracy of list.   Return in 4 days or sooner for any questions/problems before then.

## 2017-08-11 ENCOUNTER — Other Ambulatory Visit: Payer: Self-pay

## 2017-08-11 ENCOUNTER — Ambulatory Visit: Payer: Medicare HMO | Attending: Family | Admitting: Family

## 2017-08-11 ENCOUNTER — Encounter: Payer: Self-pay | Admitting: Family

## 2017-08-11 VITALS — BP 136/64 | HR 85 | Resp 18 | Ht 68.0 in | Wt 157.4 lb

## 2017-08-11 DIAGNOSIS — N189 Chronic kidney disease, unspecified: Secondary | ICD-10-CM | POA: Diagnosis not present

## 2017-08-11 DIAGNOSIS — Z9889 Other specified postprocedural states: Secondary | ICD-10-CM | POA: Diagnosis not present

## 2017-08-11 DIAGNOSIS — Z7982 Long term (current) use of aspirin: Secondary | ICD-10-CM | POA: Diagnosis not present

## 2017-08-11 DIAGNOSIS — I5032 Chronic diastolic (congestive) heart failure: Secondary | ICD-10-CM

## 2017-08-11 DIAGNOSIS — Z87891 Personal history of nicotine dependence: Secondary | ICD-10-CM | POA: Insufficient documentation

## 2017-08-11 DIAGNOSIS — I495 Sick sinus syndrome: Secondary | ICD-10-CM | POA: Diagnosis not present

## 2017-08-11 DIAGNOSIS — I509 Heart failure, unspecified: Secondary | ICD-10-CM | POA: Diagnosis not present

## 2017-08-11 DIAGNOSIS — J8410 Pulmonary fibrosis, unspecified: Secondary | ICD-10-CM | POA: Diagnosis not present

## 2017-08-11 DIAGNOSIS — D649 Anemia, unspecified: Secondary | ICD-10-CM | POA: Diagnosis not present

## 2017-08-11 DIAGNOSIS — Z79899 Other long term (current) drug therapy: Secondary | ICD-10-CM | POA: Diagnosis not present

## 2017-08-11 DIAGNOSIS — R0602 Shortness of breath: Secondary | ICD-10-CM | POA: Insufficient documentation

## 2017-08-11 DIAGNOSIS — I130 Hypertensive heart and chronic kidney disease with heart failure and stage 1 through stage 4 chronic kidney disease, or unspecified chronic kidney disease: Secondary | ICD-10-CM | POA: Insufficient documentation

## 2017-08-11 DIAGNOSIS — I10 Essential (primary) hypertension: Secondary | ICD-10-CM

## 2017-08-11 LAB — BASIC METABOLIC PANEL WITH GFR
Anion gap: 8 (ref 5–15)
BUN: 14 mg/dL (ref 6–20)
CO2: 28 mmol/L (ref 22–32)
Calcium: 9 mg/dL (ref 8.9–10.3)
Chloride: 100 mmol/L — ABNORMAL LOW (ref 101–111)
Creatinine, Ser: 0.98 mg/dL (ref 0.61–1.24)
GFR calc Af Amer: >60 mL/min (ref >60)
GFR calc non Af Amer: >60 mL/min (ref >60)
Glucose, Bld: 98 mg/dL (ref 65–99)
Potassium: 4.4 mmol/L (ref 3.5–5.1)
Sodium: 136 mmol/L (ref 135–145)

## 2017-08-11 NOTE — Progress Notes (Signed)
Patient ID: Derek Bernard, male    DOB: 1930-07-20, 83 y.o.   MRN: [id number]  HPI  Mr Eakle is an 83 y/o male with a history of anemia, BPH, CKD, gout, HTN, sick sinus syndrome (pacemaker), pulmonary fibrosis, remote tobacco use and chronic heart failure.   Echo report from 07/15/17 reviewed and showed an EF of 65% along with mild MR/TR. He also has an elevated PA pressure of 51 mm Hg. Cardiac catheterization was done 12/23/15 which showed mild 3 vessel CAD.   He was admitted 07/14/17 due to pneumonia and heart failure. Cardiology consult was obtained. Initially given IV antibiotics and furosemide. Discharged home after 4 days.   He presents today for a follow-up visit with a chief complaint of moderate shortness of breath upon minimal exertion. He describes this as chronic in nature having been present for several years with varying levels of severity. He has associated fatigue, cough and difficulty sleeping. He denies any chest pain, palpitations, dizziness or weight gain.  Past Medical History:  Diagnosis Date  . Anemia   . BPH (benign prostatic hyperplasia)   . CHF (congestive heart failure) (HCC)   . CKD (chronic kidney disease)   . Gout   . Hypertension   . Memory change   . Pacemaker 2010  . Pulmonary fibrosis (HCC)   . Sick sinus syndrome [facility])    s/p pacemaker  . Sinus bradycardia    Past Surgical History:  Procedure Laterality Date  . CATARACT EXTRACTION     Right eye  . PACEMAKER INSERTION     No family history on file. Social History   Tobacco Use  . Smoking status: Former Smoker    Packs/day: 0.50    Years: 20.00    Pack years: 10.00    Types: Cigarettes    Last attempt to quit: 1985    Years since quitting: 33.8  . Smokeless tobacco: Never Used  . Tobacco comment: Quit 20 years ago  Substance Use Topics  . Alcohol use: No    Alcohol/week: 0.0 oz   No Known Allergies  Prior to Admission medications   Medication Sig Start Date End Date Taking?  Authorizing Provider  aspirin EC 81 MG tablet Take 81 mg by mouth daily.   Yes [provider]  atorvastatin (LIPITOR) 10 MG tablet Take 10 mg by mouth daily.   Yes [provider]  brimonidine (ALPHAGAN P) 0.1 % SOLN Place 1 drop into both eyes 3 (three) times daily.   Yes [provider]  budesonide-formoterol (SYMBICORT) 160-4.5 MCG/ACT inhaler Inhale 2 puffs into the lungs 2 (two) times daily.   Yes [provider]  celecoxib (CELEBREX) 200 MG capsule Take 200 mg by mouth daily.   Yes [provider]  dorzolamide-timolol (COSOPT) 22.3-6.8 MG/ML ophthalmic solution Place 1 drop into both eyes 2 (two) times daily.   Yes [provider]  fluticasone (FLONASE) 50 MCG/ACT nasal spray Place 2 sprays into both nostrils daily.   Yes [provider]  furosemide (LASIX) 20 MG tablet Take 2 tablets (40 mg total) by mouth daily. 07/17/17  Yes Sudini, Wardell Heath, MD  IRON PO Take 1 tablet daily by mouth.   Yes [provider]  isoniazid (NYDRAZID) 300 MG tablet Take by mouth daily.   Yes [provider]  latanoprost (XALATAN) 0.005 % ophthalmic solution Place 1 drop into both eyes at bedtime.   Yes [provider]  montelukast (SINGULAIR) 10 MG tablet Take 10 mg  by mouth daily.   Yes [provider]  omeprazole (PRILOSEC) 20 MG capsule Take 40 mg by mouth daily before breakfast.   Yes [provider]  potassium chloride (K-DUR) 10 MEQ tablet Take 10 mEq by mouth daily.   Yes [provider]  zolpidem (AMBIEN) 5 MG tablet Take 1 tablet by mouth at bedtime as needed [for insomina]   Yes [provider]    Review of Systems  Constitutional: Positive for appetite change (decreased) and fatigue.  HENT: Positive for hearing loss. Negative for congestion and sore throat.   Eyes: Negative.   Respiratory: Positive for cough and shortness of breath. Negative for chest tightness.    Cardiovascular: Positive for leg swelling. Negative for chest pain and palpitations.  Gastrointestinal: Negative for abdominal distention and abdominal pain.  Endocrine: Negative.   Genitourinary: Negative.   Musculoskeletal: Negative for back pain and neck pain.  Skin: Negative.   Allergic/Immunologic: Negative.   Neurological: Negative for dizziness and light-headedness.  Hematological: Negative for adenopathy. Does not bruise/bleed easily.  Psychiatric/Behavioral: Positive for sleep disturbance (wearing oxygen @ 2L ). Negative for dysphoric mood. The patient is not nervous/anxious.    Vitals:   08/11/17 0942  BP: 136/64  Pulse: 85  Resp: 18  SpO2: 90%  Weight: 157 lb 6 oz (71.4 kg)  Height: 5\' 8"  (1.727 m)   Wt Readings from Last 3 Encounters:  08/11/17 157 lb 6 oz (71.4 kg)  08/07/17 157 lb 2 oz (71.3 kg)  07/24/17 162 lb 2 oz (73.5 kg)    Lab Results  Component Value Date   CREATININE 1.48 (H) 07/16/2017   CREATININE 1.17 07/15/2017   CREATININE 0.97 07/14/2017   Physical Exam  Constitutional: He is oriented to person, place, and time. He appears well-developed and well-nourished.  HENT:  Head: Normocephalic and atraumatic.  Right Ear: Decreased hearing is noted.  Left Ear: Decreased hearing is noted.  Neck: Normal range of motion. Neck supple. No JVD present.  Cardiovascular: Normal rate and regular rhythm.  Pulmonary/Chest: Effort normal. He has no wheezes. He has no rales.  Abdominal: Soft. He exhibits no distension. There is no tenderness.  Musculoskeletal: He exhibits edema (1+ pitting edema in bilateral lower legs). He exhibits no tenderness.  Neurological: He is alert and oriented to person, place, and time.  Skin: Skin is warm and dry.  Psychiatric: He has a normal mood and affect. His behavior is normal. Thought content normal.  Nursing note and vitals reviewed.  Assessment & Plan:  1: Chronic heart failure with preserved ejection fraction- - NYHA  class III - mildly fluid overloaded today  - weighing daily and says that his weight has fluctuated. Reminded to call for an overnight weight gain of >2 pounds or a weekly weight gain of >5 pounds - weight unchanged by our scales since he was last here - not adding salt and his son says that patient's significant other doesn't cook with salt. Reviewed the importance of reading food labels so that he can maintain daily sodium intake to 2000mg  daily. - saw cardiologist Welton Flakes([place]) 07/21/17 - has already received his flu vaccine for this season - has been taking his furosemide 40mg  BID along with potassium 10meq BID without much change in his symptoms - draw a BMP today and continue BID furosemide/potassium  2: HTN- - BP looks great today - BMP from 07/16/17 reviewed and showed sodium 138, potassium 3.5 and GFR 48  3: Pulmonary fibrosis- - wearing oxygen at  2L around the clock and can increase it to 3L if needed - saw pulmonologist Meredeth Ide([place]) 12/10/15 - has an appointment with Dr. Meredeth IdeFleming on 08/28/17  Patient did not bring his medications nor a list. Each medication was verbally reviewed with the patient and he was encouraged to bring the bottles to every visit to confirm accuracy of list.   Return in 2 weeks or sooner for any questions/problems before then.

## 2017-08-11 NOTE — Patient Instructions (Signed)
Continue weighing daily and call for an overnight weight gain of > 2 pounds or a weekly weight gain of >5 pounds. 

## 2017-08-28 ENCOUNTER — Other Ambulatory Visit: Payer: Self-pay

## 2017-08-28 ENCOUNTER — Encounter: Payer: Self-pay | Admitting: Family

## 2017-08-28 ENCOUNTER — Ambulatory Visit: Payer: Medicare HMO | Attending: Family | Admitting: Family

## 2017-08-28 VITALS — BP 126/62 | Resp 18 | Ht 68.0 in | Wt 158.1 lb

## 2017-08-28 DIAGNOSIS — N189 Chronic kidney disease, unspecified: Secondary | ICD-10-CM | POA: Insufficient documentation

## 2017-08-28 DIAGNOSIS — I130 Hypertensive heart and chronic kidney disease with heart failure and stage 1 through stage 4 chronic kidney disease, or unspecified chronic kidney disease: Secondary | ICD-10-CM | POA: Diagnosis not present

## 2017-08-28 DIAGNOSIS — I495 Sick sinus syndrome: Secondary | ICD-10-CM | POA: Insufficient documentation

## 2017-08-28 DIAGNOSIS — R0602 Shortness of breath: Secondary | ICD-10-CM | POA: Diagnosis not present

## 2017-08-28 DIAGNOSIS — N400 Enlarged prostate without lower urinary tract symptoms: Secondary | ICD-10-CM | POA: Insufficient documentation

## 2017-08-28 DIAGNOSIS — D649 Anemia, unspecified: Secondary | ICD-10-CM | POA: Diagnosis not present

## 2017-08-28 DIAGNOSIS — I2510 Atherosclerotic heart disease of native coronary artery without angina pectoris: Secondary | ICD-10-CM | POA: Diagnosis not present

## 2017-08-28 DIAGNOSIS — Z79899 Other long term (current) drug therapy: Secondary | ICD-10-CM | POA: Insufficient documentation

## 2017-08-28 DIAGNOSIS — I5032 Chronic diastolic (congestive) heart failure: Secondary | ICD-10-CM | POA: Diagnosis present

## 2017-08-28 DIAGNOSIS — Z87891 Personal history of nicotine dependence: Secondary | ICD-10-CM | POA: Diagnosis not present

## 2017-08-28 DIAGNOSIS — M109 Gout, unspecified: Secondary | ICD-10-CM | POA: Diagnosis not present

## 2017-08-28 DIAGNOSIS — Z7982 Long term (current) use of aspirin: Secondary | ICD-10-CM | POA: Insufficient documentation

## 2017-08-28 DIAGNOSIS — I10 Essential (primary) hypertension: Secondary | ICD-10-CM

## 2017-08-28 DIAGNOSIS — J8410 Pulmonary fibrosis, unspecified: Secondary | ICD-10-CM | POA: Insufficient documentation

## 2017-08-28 MED ORDER — FUROSEMIDE 40 MG PO TABS: 40.0000 mg | ORAL_TABLET | Freq: Two times a day (BID) | ORAL | 5 refills | Status: DC

## 2017-08-28 NOTE — Progress Notes (Signed)
Patient ID: CHANCELER Bernard, male    DOB: 1930-07-27, 83 y.o.   MRN: [id number]  HPI  Mr Derek Bernard is an 83 y/o male with a history of anemia, BPH, CKD, gout, HTN, sick sinus syndrome (pacemaker), pulmonary fibrosis, remote tobacco use and chronic heart failure.   Echo report from 07/15/17 reviewed and showed an EF of 65% along with mild MR/TR. He also has an elevated PA pressure of 51 mm Hg. Cardiac catheterization was done 12/23/15 which showed mild 3 vessel CAD.   He was admitted 07/14/17 due to pneumonia and heart failure. Cardiology consult was obtained. Initially given IV antibiotics and furosemide. Discharged home after 4 days.   He presents today for a follow-up visit with a chief complaint of moderate shortness of breath with minimal exertion. He describes this as chronic in nature having been present for several years with varying levels of severity. He has associated fatigue, cough and minimal edema along with this. He denies any chest pain, palpitations or dizziness.   Past Medical History:  Diagnosis Date  . Anemia   . BPH (benign prostatic hyperplasia)   . CHF (congestive heart failure) (HCC)   . CKD (chronic kidney disease)   . Gout   . Hypertension   . Memory change   . Pacemaker 2010  . Pulmonary fibrosis (HCC)   . Sick sinus syndrome [facility])    s/p pacemaker  . Sinus bradycardia    Past Surgical History:  Procedure Laterality Date  . CARDIAC CATHETERIZATION N/A 12/23/2015   Procedure: Left Heart Cath and Coronary Angiography;  Surgeon: Derek Blinks, MD;  Location: [facility] INVASIVE CV LAB;  Service: Cardiovascular;  Laterality: N/A;  . CATARACT EXTRACTION     Right eye  . PACEMAKER INSERTION     No family history on file. Social History   Tobacco Use  . Smoking status: Former Smoker    Packs/day: 0.50    Years: 20.00    Pack years: 10.00    Types: Cigarettes    Last attempt to quit: 1985    Years since quitting: 33.9  . Smokeless tobacco: Never Used  .  Tobacco comment: Quit 20 years ago  Substance Use Topics  . Alcohol use: No    Alcohol/week: 0.0 oz   No Known Allergies  Prior to Admission medications   Medication Sig Start Date End Date Taking? Authorizing Provider  aspirin EC 81 MG tablet Take 81 mg by mouth daily.   Yes [provider]  atorvastatin (LIPITOR) 10 MG tablet Take 10 mg by mouth daily.   Yes [provider]  brimonidine (ALPHAGAN P) 0.1 % SOLN Place 1 drop into both eyes 3 (three) times daily.   Yes [provider]  budesonide-formoterol (SYMBICORT) 160-4.5 MCG/ACT inhaler Inhale 2 puffs into the lungs 2 (two) times daily.   Yes [provider]  celecoxib (CELEBREX) 200 MG capsule Take 200 mg by mouth daily.   Yes [provider]  dorzolamide-timolol (COSOPT) 22.3-6.8 MG/ML ophthalmic solution Place 1 drop into both eyes 2 (two) times daily.   Yes [provider]  fluticasone (FLONASE) 50 MCG/ACT nasal spray Place 2 sprays into both nostrils daily.   Yes [provider]  furosemide (LASIX) 40 MG tablet Take 1 tablet (40 mg total) by mouth 2 (two) times daily. 08/28/17  Yes Ezri Fanguy A, FNP  IRON PO Take 1 tablet daily by mouth.   Yes [provider]  isoniazid (NYDRAZID) 300 MG tablet Take  by mouth daily.   Yes [provider]  latanoprost (XALATAN) 0.005 % ophthalmic solution Place 1 drop into both eyes at bedtime.   Yes [provider]  montelukast (SINGULAIR) 10 MG tablet Take 10 mg by mouth daily.   Yes [provider]  omeprazole (PRILOSEC) 20 MG capsule Take 40 mg by mouth daily before breakfast.   Yes [provider]  potassium chloride (K-DUR,KLOR-CON) 10 MEQ tablet Take 10 mEq 2 (two) times daily by mouth.   Yes [provider]  traZODone (DESYREL) 100 MG tablet Take 100 mg by mouth at bedtime.   Yes [provider]    Review of Systems  Constitutional: Positive for appetite change  (decreased) and fatigue.  HENT: Positive for hearing loss. Negative for congestion and sore throat.   Eyes: Negative.   Respiratory: Positive for cough (productive sputum) and shortness of breath. Negative for chest tightness and wheezing.   Cardiovascular: Positive for leg swelling. Negative for chest pain and palpitations.  Gastrointestinal: Negative for abdominal distention and abdominal pain.  Endocrine: Negative.   Genitourinary: Negative.   Musculoskeletal: Negative for back pain and neck pain.  Skin: Negative.   Allergic/Immunologic: Negative.   Neurological: Negative for dizziness and light-headedness.  Hematological: Negative for adenopathy. Does not bruise/bleed easily.  Psychiatric/Behavioral: Positive for sleep disturbance (wearing oxygen @ 2L ). Negative for dysphoric mood. The patient is not nervous/anxious.    Vitals:   08/28/17 0855  BP: 126/62  Resp: 18  SpO2: 93%  Weight: 158 lb 2 oz (71.7 kg)  Height: 5\' 8"  (1.727 m)   Wt Readings from Last 3 Encounters:  08/28/17 158 lb 2 oz (71.7 kg)  08/11/17 157 lb 6 oz (71.4 kg)  08/07/17 157 lb 2 oz (71.3 kg)    Lab Results  Component Value Date   CREATININE 0.98 08/11/2017   CREATININE 1.48 (H) 07/16/2017   CREATININE 1.17 07/15/2017   Physical Exam  Constitutional: He is oriented to person, place, and time. He appears well-developed and well-nourished.  HENT:  Head: Normocephalic and atraumatic.  Right Ear: Decreased hearing is noted.  Left Ear: Decreased hearing is noted.  Neck: Normal range of motion. Neck supple. No JVD present.  Cardiovascular: Normal rate and regular rhythm.  Pulmonary/Chest: Effort normal. He has no wheezes. He has no rales.  Abdominal: Soft. He exhibits no distension. There is no tenderness.  Musculoskeletal: He exhibits edema (trace pitting edema in bilateral lower legs). He exhibits no tenderness.  Neurological: He is alert and oriented to person, place, and time.  Skin: Skin is warm  and dry.  Psychiatric: He has a normal mood and affect. His behavior is normal. Thought content normal.  Nursing note and vitals reviewed.  Assessment & Plan:  1: Chronic heart failure with preserved ejection fraction- - NYHA class III - euvolemic today  - weighing daily and says that his weight has been stable. Reminded to call for an overnight weight gain of >2 pounds or a weekly weight gain of >5 pounds - not adding salt and his son says that patient's significant other doesn't cook with salt. Reviewed the importance of reading food labels so that he can maintain daily sodium intake to 2000mg  daily. - saw cardiologist Welton Flakes([place]) 07/21/17 - has already received his flu vaccine for this season - has been taking his furosemide 40mg  BID along with potassium 10meq BID and feels like the edema has improved  2: HTN- - BP looks great today - BMP from  08/11/17 reviewed and showed sodium 136, potassium 4.4 and GFR >60  3: Pulmonary fibrosis- - wearing oxygen at 2L around the clock and can increase it to 3L if needed - saw pulmonologist Meredeth Ide([place]) 12/10/15 - has an appointment with Dr. Meredeth IdeFleming later today  Patient did not bring his medications nor a list. Each medication was verbally reviewed with the patient and he was encouraged to bring the bottles to every visit to confirm accuracy of list.   Return in 2 months or sooner for any questions/problems before then.

## 2017-08-28 NOTE — Patient Instructions (Signed)
Continue weighing daily and call for an overnight weight gain of > 2 pounds or a weekly weight gain of >5 pounds. 

## 2017-10-22 NOTE — Progress Notes (Signed)
Patient ID: Derek Bernard, male    DOB: [birth year], 84 y.o.   MRN: [id number]  HPI  Derek Bernard is an 84 y/o male with a history of anemia, BPH, CKD, gout, HTN, sick sinus syndrome (pacemaker), pulmonary fibrosis, remote tobacco use and chronic heart failure.   Echo report from 07/15/17 reviewed and showed an EF of 65% along with mild Derek/TR. He also has an elevated PA pressure of 51 mm Hg. Cardiac catheterization was done 12/23/15 which showed mild 3 vessel CAD.   He was admitted 07/14/17 due to pneumonia and heart failure. Cardiology consult was obtained. Initially given IV antibiotics and furosemide. Discharged home after 4 days.   He presents today for a follow-up visit with a chief complaint of minimal shortness of breath upon moderate exertion. He describes this as chronic in nature having been present for several years with varying levels of severity. He does feel like his breathing has improved a little bit. He has associated fatigue, cough, edema and chronic difficulty sleeping along with this. He denies any chest pain, palpitations, abdominal distention, dizziness or weight gain.   Past Medical History:  Diagnosis Date  . Anemia   . BPH (benign prostatic hyperplasia)   . CHF (congestive heart failure) (HCC)   . CKD (chronic kidney disease)   . Gout   . Hypertension   . Memory change   . Pacemaker 2010  . Pulmonary fibrosis (HCC)   . Sick sinus syndrome [facility])    s/p pacemaker  . Sinus bradycardia    Past Surgical History:  Procedure Laterality Date  . CARDIAC CATHETERIZATION N/A 12/23/2015   Procedure: Left Heart Cath and Coronary Angiography;  Surgeon: Lamar Blinks, MD;  Location: [facility] INVASIVE CV LAB;  Service: Cardiovascular;  Laterality: N/A;  . CATARACT EXTRACTION     Right eye  . PACEMAKER INSERTION     No family history on file. Social History   Tobacco Use  . Smoking status: Former Smoker    Packs/day: 0.50    Years: 20.00    Pack years: 10.00    Types:  Cigarettes    Last attempt to quit: 1985    Years since quitting: 34.0  . Smokeless tobacco: Never Used  . Tobacco comment: Quit 20 years ago  Substance Use Topics  . Alcohol use: No    Alcohol/week: 0.0 oz   No Known Allergies  Prior to Admission medications   Medication Sig Start Date End Date Taking? Authorizing Provider  aspirin EC 81 MG tablet Take 81 mg by mouth daily.   Yes [provider]  atorvastatin (LIPITOR) 10 MG tablet Take 10 mg by mouth daily.   Yes [provider]  brimonidine (ALPHAGAN P) 0.1 % SOLN Place 1 drop into both eyes 3 (three) times daily.   Yes [provider]  budesonide-formoterol (SYMBICORT) 160-4.5 MCG/ACT inhaler Inhale 2 puffs into the lungs 2 (two) times daily. Pt was using on prn at night- educated on proper use   Yes [provider]  dorzolamide-timolol (COSOPT) 22.3-6.8 MG/ML ophthalmic solution Place 1 drop into both eyes 2 (two) times daily.   Yes [provider]  fluticasone (FLONASE) 50 MCG/ACT nasal spray Place 2 sprays into both nostrils daily.   Yes [provider]  furosemide (LASIX) 40 MG tablet Take 1 tablet (40 mg total) by mouth 2 (two) times daily. 08/28/17  Yes Hackney, Tina A, FNP  IRON PO Take 1 tablet daily by mouth.   Yes  [provider]  latanoprost (XALATAN) 0.005 % ophthalmic solution Place 1 drop into both eyes at bedtime.   Yes [provider]  montelukast (SINGULAIR) 10 MG tablet Take 10 mg by mouth daily.   Yes [provider]  potassium chloride (K-DUR,KLOR-CON) 10 MEQ tablet Take 10 mEq by mouth 2 (two) times daily. Pt son thinks he is taking once a day   Yes [provider]  traZODone (DESYREL) 100 MG tablet Take 100 mg by mouth at bedtime.   Yes [provider]  isoniazid (NYDRAZID) 300 MG tablet Take by mouth daily.    [provider]    Review of Systems  Constitutional: Positive for appetite change and fatigue.   HENT: Positive for hearing loss. Negative for congestion and sore throat.   Eyes: Negative.   Respiratory: Positive for cough (productive sputum) and shortness of breath. Negative for chest tightness and wheezing.   Cardiovascular: Positive for leg swelling (mininimal). Negative for chest pain and palpitations.  Gastrointestinal: Negative for abdominal distention and abdominal pain.  Endocrine: Negative.   Genitourinary: Negative.   Musculoskeletal: Negative for back pain and neck pain.  Skin: Negative.   Allergic/Immunologic: Negative.   Neurological: Negative for dizziness and light-headedness.  Hematological: Negative for adenopathy. Does not bruise/bleed easily.  Psychiatric/Behavioral: Positive for sleep disturbance (wearing oxygen @ 2L ). Negative for dysphoric mood. The patient is not nervous/anxious.    Vitals:   10/23/17 0910  BP: (!) 121/52  Pulse: 79  Resp: 18  SpO2: 94%  Weight: 157 lb 4 oz (71.3 kg)  Height: 5\' 8"  (1.727 m)   Wt Readings from Last 3 Encounters:  10/23/17 157 lb 4 oz (71.3 kg)  08/28/17 158 lb 2 oz (71.7 kg)  08/11/17 157 lb 6 oz (71.4 kg)   Lab Results  Component Value Date   CREATININE 0.98 08/11/2017   CREATININE 1.48 (H) 07/16/2017   CREATININE 1.17 07/15/2017    Physical Exam  Constitutional: He is oriented to person, place, and time. He appears well-developed and well-nourished.  HENT:  Head: Normocephalic and atraumatic.  Right Ear: Decreased hearing is noted.  Left Ear: Decreased hearing is noted.  Neck: Normal range of motion. Neck supple. No JVD present.  Cardiovascular: Normal rate and regular rhythm.  Pulmonary/Chest: Effort normal. He has no wheezes. He has no rales.  Abdominal: Soft. He exhibits no distension. There is no tenderness.  Musculoskeletal: He exhibits edema (trace pitting edema in bilateral lower legs). He exhibits no tenderness.  Neurological: He is alert and oriented to person, place, and time.  Skin: Skin is  warm and dry.  Psychiatric: He has a normal mood and affect. His behavior is normal. Thought content normal.  Nursing note and vitals reviewed.  Assessment & Plan:  1: Chronic heart failure with preserved ejection fraction- - NYHA class II - euvolemic today  - weighing daily and says that his weight has been stable. Reminded to call for an overnight weight gain of >2 pounds or a weekly weight gain of >5 pounds - not adding salt and his son says that patient's significant other doesn't cook with salt. Reviewed the importance of reading food labels so that he can maintain daily sodium intake to 2000mg  daily. - saw cardiologist Welton Flakes([place]) 07/21/17 - has already received his flu vaccine for this season - son says that he thinks the patient is taking furosemide and potassium twice daily but he's not entirely sure as his wife does patient's pillbox for the  week. The son will doublecheck when he returns home and call us back if he's not taking it twice daily  2: HTN- - BP looks great today - BMP from 08/11/17 reviewed and showed sodium 136, potassium 4.4 and GFR >60 - son says that patient has an upcoming appointment with his PCP  3: Pulmonary fibrosis- - wearing oxygen at 2L around the clock and can increase it to 3L if needed - saw pulmonologist Meredeth Ide) 08/28/17  Patient did not bring his medications nor a list. Each medication was verbally reviewed with the patient and he was encouraged to bring the bottles to every visit to confirm accuracy of list.   Return in 6 months or sooner for any questions/problems before then.

## 2017-10-23 ENCOUNTER — Encounter: Payer: Self-pay | Admitting: Family

## 2017-10-23 ENCOUNTER — Ambulatory Visit: Payer: Medicare HMO | Attending: Family | Admitting: Family

## 2017-10-23 VITALS — BP 121/52 | HR 79 | Resp 18 | Ht 68.0 in | Wt 157.2 lb

## 2017-10-23 DIAGNOSIS — Z950 Presence of cardiac pacemaker: Secondary | ICD-10-CM | POA: Insufficient documentation

## 2017-10-23 DIAGNOSIS — I2510 Atherosclerotic heart disease of native coronary artery without angina pectoris: Secondary | ICD-10-CM | POA: Insufficient documentation

## 2017-10-23 DIAGNOSIS — J8410 Pulmonary fibrosis, unspecified: Secondary | ICD-10-CM | POA: Diagnosis not present

## 2017-10-23 DIAGNOSIS — N189 Chronic kidney disease, unspecified: Secondary | ICD-10-CM | POA: Insufficient documentation

## 2017-10-23 DIAGNOSIS — Z87891 Personal history of nicotine dependence: Secondary | ICD-10-CM | POA: Insufficient documentation

## 2017-10-23 DIAGNOSIS — Z7982 Long term (current) use of aspirin: Secondary | ICD-10-CM | POA: Diagnosis not present

## 2017-10-23 DIAGNOSIS — M109 Gout, unspecified: Secondary | ICD-10-CM | POA: Diagnosis not present

## 2017-10-23 DIAGNOSIS — I495 Sick sinus syndrome: Secondary | ICD-10-CM | POA: Insufficient documentation

## 2017-10-23 DIAGNOSIS — I130 Hypertensive heart and chronic kidney disease with heart failure and stage 1 through stage 4 chronic kidney disease, or unspecified chronic kidney disease: Secondary | ICD-10-CM | POA: Diagnosis not present

## 2017-10-23 DIAGNOSIS — D649 Anemia, unspecified: Secondary | ICD-10-CM | POA: Insufficient documentation

## 2017-10-23 DIAGNOSIS — N400 Enlarged prostate without lower urinary tract symptoms: Secondary | ICD-10-CM | POA: Insufficient documentation

## 2017-10-23 DIAGNOSIS — Z79899 Other long term (current) drug therapy: Secondary | ICD-10-CM | POA: Insufficient documentation

## 2017-10-23 DIAGNOSIS — I5032 Chronic diastolic (congestive) heart failure: Secondary | ICD-10-CM | POA: Insufficient documentation

## 2017-10-23 DIAGNOSIS — R0602 Shortness of breath: Secondary | ICD-10-CM | POA: Diagnosis present

## 2017-10-23 DIAGNOSIS — I10 Essential (primary) hypertension: Secondary | ICD-10-CM

## 2017-10-23 NOTE — Patient Instructions (Signed)
Continue weighing daily and call for an overnight weight gain of > 2 pounds or a weekly weight gain of >5 pounds. 

## 2017-11-08 ENCOUNTER — Other Ambulatory Visit: Payer: Self-pay | Admitting: *Deleted

## 2017-11-08 DIAGNOSIS — D696 Thrombocytopenia, unspecified: Secondary | ICD-10-CM

## 2017-11-09 ENCOUNTER — Inpatient Hospital Stay: Payer: Medicare HMO | Attending: Oncology | Admitting: Oncology

## 2017-11-09 ENCOUNTER — Other Ambulatory Visit: Payer: Self-pay | Admitting: *Deleted

## 2017-11-09 ENCOUNTER — Encounter: Payer: Self-pay | Admitting: Oncology

## 2017-11-09 ENCOUNTER — Inpatient Hospital Stay: Payer: Medicare HMO

## 2017-11-09 VITALS — BP 112/62 | HR 82 | Temp 98.2°F | Resp 18 | Ht 68.0 in | Wt 152.0 lb

## 2017-11-09 DIAGNOSIS — D696 Thrombocytopenia, unspecified: Secondary | ICD-10-CM

## 2017-11-09 DIAGNOSIS — D509 Iron deficiency anemia, unspecified: Secondary | ICD-10-CM | POA: Diagnosis not present

## 2017-11-09 DIAGNOSIS — R970 Elevated carcinoembryonic antigen [CEA]: Secondary | ICD-10-CM | POA: Insufficient documentation

## 2017-11-09 DIAGNOSIS — K219 Gastro-esophageal reflux disease without esophagitis: Secondary | ICD-10-CM | POA: Diagnosis not present

## 2017-11-09 DIAGNOSIS — I495 Sick sinus syndrome: Secondary | ICD-10-CM | POA: Insufficient documentation

## 2017-11-09 DIAGNOSIS — Z7982 Long term (current) use of aspirin: Secondary | ICD-10-CM | POA: Diagnosis not present

## 2017-11-09 DIAGNOSIS — E785 Hyperlipidemia, unspecified: Secondary | ICD-10-CM | POA: Insufficient documentation

## 2017-11-09 DIAGNOSIS — M109 Gout, unspecified: Secondary | ICD-10-CM | POA: Insufficient documentation

## 2017-11-09 DIAGNOSIS — Z87891 Personal history of nicotine dependence: Secondary | ICD-10-CM | POA: Insufficient documentation

## 2017-11-09 DIAGNOSIS — I509 Heart failure, unspecified: Secondary | ICD-10-CM | POA: Insufficient documentation

## 2017-11-09 DIAGNOSIS — J8410 Pulmonary fibrosis, unspecified: Secondary | ICD-10-CM | POA: Diagnosis not present

## 2017-11-09 DIAGNOSIS — D631 Anemia in chronic kidney disease: Secondary | ICD-10-CM

## 2017-11-09 DIAGNOSIS — Z79899 Other long term (current) drug therapy: Secondary | ICD-10-CM | POA: Diagnosis not present

## 2017-11-09 DIAGNOSIS — N400 Enlarged prostate without lower urinary tract symptoms: Secondary | ICD-10-CM | POA: Diagnosis not present

## 2017-11-09 DIAGNOSIS — N189 Chronic kidney disease, unspecified: Secondary | ICD-10-CM | POA: Diagnosis not present

## 2017-11-09 DIAGNOSIS — I130 Hypertensive heart and chronic kidney disease with heart failure and stage 1 through stage 4 chronic kidney disease, or unspecified chronic kidney disease: Secondary | ICD-10-CM | POA: Diagnosis not present

## 2017-11-09 DIAGNOSIS — F0390 Unspecified dementia without behavioral disturbance: Secondary | ICD-10-CM | POA: Diagnosis not present

## 2017-11-09 DIAGNOSIS — Z950 Presence of cardiac pacemaker: Secondary | ICD-10-CM | POA: Insufficient documentation

## 2017-11-09 DIAGNOSIS — D649 Anemia, unspecified: Secondary | ICD-10-CM

## 2017-11-09 LAB — CBC WITH DIFFERENTIAL/PLATELET
BASOS PCT: 1 %
Basophils Absolute: 0.1 10*3/uL (ref 0–0.1)
EOS ABS: 0.5 10*3/uL (ref 0–0.7)
Eosinophils Relative: 8 %
HCT: 37.7 % — ABNORMAL LOW (ref 40.0–52.0)
Hemoglobin: 11.4 g/dL — ABNORMAL LOW (ref 13.0–18.0)
Lymphocytes Relative: 31 %
Lymphs Abs: 1.7 10*3/uL (ref 1.0–3.6)
MCH: 22 pg — ABNORMAL LOW (ref 26.0–34.0)
MCHC: 30.2 g/dL — AB (ref 32.0–36.0)
MCV: 72.8 fL — ABNORMAL LOW (ref 80.0–100.0)
MONO ABS: 0.6 10*3/uL (ref 0.2–1.0)
MONOS PCT: 11 %
Neutro Abs: 2.7 10*3/uL (ref 1.4–6.5)
Neutrophils Relative %: 49 %
PLATELETS: 120 10*3/uL — AB (ref 150–440)
RBC: 5.17 MIL/uL (ref 4.40–5.90)
RDW: 18.5 % — AB (ref 11.5–14.5)
WBC: 5.6 10*3/uL (ref 3.8–10.6)

## 2017-11-10 NOTE — Progress Notes (Signed)
Hematology/Oncology Consult note [facility]  [phone] [phone]  Patient Care Team: Sherron Monday, MD as PCP - General (Internal Medicine) Benita Gutter, RN as Registered Nurse Delma Freeze, FNP as Nurse Practitioner (Family Medicine) Laurier Nancy, MD as Consulting Physician (Cardiology)   Name of the patient: Derek Bernard  [id number]  16-Dec-1929   Date of visit: 11/10/17  Diagnosis-microcytic anemia likely secondary to anemia of chronic disease along with some component of iron deficiency.  Chief complaint/ Reason for visit- routine follow-up of anemia  Heme/Onc history: patient is a 84 year old male with a past medical history significant for dementia, hyperlipidemia, GERD, and other medical problems. There was an ongoing concern about loss when Dr. Ellsworth Lennox saw him recently. Patient states that he may have lost about 10 pounds of weight over the last couple of years but he is not sure. There was also concern about GI bleed however patient denies any blood in his sputum or urine. CBC checked on 12/30/2016 showed white count of 4, H&H of 11.6/39.4 with an MCV of 74 and platelet count of 119. BMP and liver functions were within normal limits.Patient had a CEA checked which was mildly elevated at 3 and subsequent CT chest abdomen and pelvis did not reveal any evidence of malignant he also had a 125 CA 2729 checked which were elevated at 51.5 and 48.7 respectively.He has been referred to [place] for suspected malignancy.  Patient lives alone and is independent of his ADLs and IADLs. He is able to drive. He does not know much about his medical history and medications that he is on. He is you are with his son today who is also not aware off his medications and medical problems in detail. Overall patient reports feeling well and denies any pain, fatigue.  Patient states that he has been seen by [facility] in the past and  has undergone a colonoscopy about 7 years ago  Ct chest abdomen and plevis done by pcp in April 2018 showed no evidence of malignany  With regards to workup for microcytosis: Iron studies were within normal limits except for a mildly low iron saturation of 15%.  Ferritin was elevated and TIBC was normal.  Hemoglobinopathy evaluation revealed a normal adult hemoglobin pattern.  B12 and folate were within normal limits   Interval history-patient was hospitalized a couple of months ago for CHF exacerbation and he does follow up with cardiology for the same.  Patient was also referred to [facility] GI for possible iron deficiency.  Initially the plan was to do an EGD and colonoscopy but he has not had the procedure done yet probably due to cardiac clearance  ECOG PS- 2 Pain scale- 0   Review of systems- Review of Systems  Constitutional: Negative for chills, fever, malaise/fatigue and weight loss.  HENT: Negative for congestion, ear discharge and nosebleeds.   Eyes: Negative for blurred vision.  Respiratory: Positive for shortness of breath. Negative for cough, hemoptysis, sputum production and wheezing.   Cardiovascular: Positive for leg swelling. Negative for chest pain, palpitations, orthopnea and claudication.  Gastrointestinal: Negative for abdominal pain, blood in stool, constipation, diarrhea, heartburn, melena, nausea and vomiting.  Genitourinary: Negative for dysuria, flank pain, frequency, hematuria and urgency.  Musculoskeletal: Negative for back pain, joint pain and myalgias.  Skin: Negative for rash.  Neurological: Negative for dizziness, tingling, focal weakness, seizures, weakness and headaches.  Endo/Heme/Allergies: Does not bruise/bleed easily.  Psychiatric/Behavioral: Negative for  depression and suicidal ideas. The patient does not have insomnia.      No Known Allergies   Past Medical History:  Diagnosis Date  . Anemia   . BPH (benign prostatic hyperplasia)   .  CHF (congestive heart failure) (HCC)   . CKD (chronic kidney disease)   . Gout   . Hypertension   . Memory change   . Pacemaker 2010  . Pulmonary fibrosis (HCC)   . Sick sinus syndrome [facility])    s/p pacemaker  . Sinus bradycardia      Past Surgical History:  Procedure Laterality Date  . CARDIAC CATHETERIZATION N/A 12/23/2015   Procedure: Left Heart Cath and Coronary Angiography;  Surgeon: Lamar Blinks, MD;  Location: [facility] INVASIVE CV LAB;  Service: Cardiovascular;  Laterality: N/A;  . CATARACT EXTRACTION     Right eye  . PACEMAKER INSERTION      Social History   Socioeconomic History  . Marital status: Widowed    Spouse name: Not on file  . Number of children: 11  . Years of education: 3  . Highest education level: 3rd grade  Social Needs  . Financial resource strain: Not hard at all  . Food insecurity - worry: Never true  . Food insecurity - inability: Never true  . Transportation needs - medical: No  . Transportation needs - non-medical: No  Occupational History  . Not on file  Tobacco Use  . Smoking status: Former Smoker    Packs/day: 0.50    Years: 20.00    Pack years: 10.00    Types: Cigarettes    Last attempt to quit: 1985    Years since quitting: 34.1  . Smokeless tobacco: Never Used  . Tobacco comment: Quit 20 years ago  Substance and Sexual Activity  . Alcohol use: No    Alcohol/week: 0.0 oz  . Drug use: No  . Sexual activity: No  Other Topics Concern  . Not on file  Social History Narrative   Independent, lives at home by himself.    History reviewed. No pertinent family history.   Current Outpatient Medications:  .  aspirin EC 81 MG tablet, Take 81 mg by mouth daily., Disp: , Rfl:  .  atorvastatin (LIPITOR) 10 MG tablet, Take 10 mg by mouth daily., Disp: , Rfl:  .  brimonidine (ALPHAGAN P) 0.1 % SOLN, Place 1 drop into both eyes 3 (three) times daily., Disp: , Rfl:  .  budesonide-formoterol (SYMBICORT) 160-4.5 MCG/ACT inhaler, Inhale 2  puffs into the lungs 2 (two) times daily. Pt was using on prn at night- educated on proper use, Disp: , Rfl:  .  dorzolamide-timolol (COSOPT) 22.3-6.8 MG/ML ophthalmic solution, Place 1 drop into both eyes 2 (two) times daily., Disp: , Rfl:  .  fluticasone (FLONASE) 50 MCG/ACT nasal spray, Place 2 sprays into both nostrils daily., Disp: , Rfl:  .  furosemide (LASIX) 40 MG tablet, Take 1 tablet (40 mg total) by mouth 2 (two) times daily., Disp: 60 tablet, Rfl: 5 .  IRON PO, Take 1 tablet daily by mouth., Disp: , Rfl:  .  isoniazid (NYDRAZID) 300 MG tablet, Take by mouth daily., Disp: , Rfl:  .  latanoprost (XALATAN) 0.005 % ophthalmic solution, Place 1 drop into both eyes at bedtime., Disp: , Rfl:  .  montelukast (SINGULAIR) 10 MG tablet, Take 10 mg by mouth daily., Disp: , Rfl:  .  potassium chloride (K-DUR,KLOR-CON) 10 MEQ tablet, Take 10 mEq by mouth 2 (two) times  daily. Pt son thinks he is taking once a day, Disp: , Rfl:  .  traZODone (DESYREL) 100 MG tablet, Take 100 mg by mouth at bedtime., Disp: , Rfl:   Physical exam:  Vitals:   11/09/17 1508  BP: 112/62  Pulse: 82  Resp: 18  Temp: 98.2 F (36.8 C)  TempSrc: Tympanic  SpO2: 98%  Weight: 152 lb (68.9 kg)  Height: 5\' 8"  (1.727 m)   Physical Exam  Constitutional: He is oriented to person, place, and time.  Thin elderly frail gentleman on home oxygen  HENT:  Head: Normocephalic and atraumatic.  Eyes: EOM are normal. Pupils are equal, round, and reactive to light.  Neck: Normal range of motion.  Cardiovascular: Normal rate, regular rhythm and normal heart sounds.  Pulmonary/Chest: Effort normal and breath sounds normal.  Abdominal: Soft. Bowel sounds are normal.  Musculoskeletal: He exhibits edema (Trace bilateral).  Neurological: He is alert and oriented to person, place, and time.  Skin: Skin is warm and dry.     CMP Latest Ref Rng & Units 08/11/2017  Glucose 65 - 99 mg/dL 98  BUN 6 - 20 mg/dL 14  Creatinine 0.980.61 - 1.191.24  mg/dL 1.470.98  Sodium 829135 - 562145 mmol/L 136  Potassium 3.5 - 5.1 mmol/L 4.4  Chloride 101 - 111 mmol/L 100(L)  CO2 22 - 32 mmol/L 28  Calcium 8.9 - 10.3 mg/dL 9.0  Total Protein 6.5 - 8.1 g/dL -  Total Bilirubin 0.3 - 1.2 mg/dL -  Alkaline Phos 38 - 130126 U/L -  AST 15 - 41 U/L -  ALT 17 - 63 U/L -   CBC Latest Ref Rng & Units 11/09/2017  WBC 3.8 - 10.6 K/uL 5.6  Hemoglobin 13.0 - 18.0 g/dL 11.4(L)  Hematocrit 40.0 - 52.0 % 37.7(L)  Platelets 150 - 440 K/uL 120(L)      Assessment and plan- Patient is a 84 y.o. male with following medical issues:  1.  Microcytic anemia: Likely secondary to chronic disease.  Prior iron studies showed some component of iron deficiency.  He is currently on oral iron once a day.  Predominantly his anemia is likely secondary to anemia of chronic disease.  He also has mild thrombocytopenia which is chronic over the last few years and stable.  Repeat CBC ferritin and iron studies as well as B12 and folate in 3 and 6 months and I will see him back in 6 months  2.  He was initially referred to us for elevated CEA and CA 125.  CEA was mildly elevated.  Subsequent CT chest abdomen and pelvis did not reveal any malignancy in April.  There is no role for checking tumor markers in the absence of malignancy and I do not reck recommend surveillance imaging at this time as well   Visit Diagnosis 1. Microcytic anemia      Dr. Owens SharkArchana Genova Kiner, MD, MPH [facility] Pager- [phone] 11/10/2017 12:18 PM

## 2017-11-28 ENCOUNTER — Other Ambulatory Visit: Payer: Self-pay | Admitting: Specialist

## 2017-11-28 DIAGNOSIS — J8410 Pulmonary fibrosis, unspecified: Secondary | ICD-10-CM

## 2017-11-28 DIAGNOSIS — R0602 Shortness of breath: Secondary | ICD-10-CM

## 2018-01-08 ENCOUNTER — Ambulatory Visit: Admission: RE | Admit: 2018-01-08 | Payer: Medicare HMO | Source: Ambulatory Visit

## 2018-01-29 ENCOUNTER — Other Ambulatory Visit: Payer: Self-pay | Admitting: Specialist

## 2018-01-29 DIAGNOSIS — J84112 Idiopathic pulmonary fibrosis: Secondary | ICD-10-CM

## 2018-02-08 ENCOUNTER — Inpatient Hospital Stay: Payer: Medicare HMO | Attending: Oncology

## 2018-02-08 DIAGNOSIS — Z950 Presence of cardiac pacemaker: Secondary | ICD-10-CM | POA: Diagnosis not present

## 2018-02-08 DIAGNOSIS — Z7982 Long term (current) use of aspirin: Secondary | ICD-10-CM | POA: Insufficient documentation

## 2018-02-08 DIAGNOSIS — Z79899 Other long term (current) drug therapy: Secondary | ICD-10-CM | POA: Diagnosis not present

## 2018-02-08 DIAGNOSIS — J8410 Pulmonary fibrosis, unspecified: Secondary | ICD-10-CM | POA: Diagnosis not present

## 2018-02-08 DIAGNOSIS — K219 Gastro-esophageal reflux disease without esophagitis: Secondary | ICD-10-CM | POA: Insufficient documentation

## 2018-02-08 DIAGNOSIS — N189 Chronic kidney disease, unspecified: Secondary | ICD-10-CM | POA: Diagnosis not present

## 2018-02-08 DIAGNOSIS — I495 Sick sinus syndrome: Secondary | ICD-10-CM | POA: Diagnosis not present

## 2018-02-08 DIAGNOSIS — R970 Elevated carcinoembryonic antigen [CEA]: Secondary | ICD-10-CM | POA: Insufficient documentation

## 2018-02-08 DIAGNOSIS — D631 Anemia in chronic kidney disease: Secondary | ICD-10-CM | POA: Diagnosis not present

## 2018-02-08 DIAGNOSIS — N400 Enlarged prostate without lower urinary tract symptoms: Secondary | ICD-10-CM | POA: Diagnosis not present

## 2018-02-08 DIAGNOSIS — Z87891 Personal history of nicotine dependence: Secondary | ICD-10-CM | POA: Diagnosis not present

## 2018-02-08 DIAGNOSIS — F0390 Unspecified dementia without behavioral disturbance: Secondary | ICD-10-CM | POA: Diagnosis not present

## 2018-02-08 DIAGNOSIS — D649 Anemia, unspecified: Secondary | ICD-10-CM

## 2018-02-08 DIAGNOSIS — I509 Heart failure, unspecified: Secondary | ICD-10-CM | POA: Insufficient documentation

## 2018-02-08 DIAGNOSIS — D509 Iron deficiency anemia, unspecified: Secondary | ICD-10-CM | POA: Insufficient documentation

## 2018-02-08 DIAGNOSIS — I130 Hypertensive heart and chronic kidney disease with heart failure and stage 1 through stage 4 chronic kidney disease, or unspecified chronic kidney disease: Secondary | ICD-10-CM | POA: Diagnosis not present

## 2018-02-08 DIAGNOSIS — E785 Hyperlipidemia, unspecified: Secondary | ICD-10-CM | POA: Diagnosis not present

## 2018-02-08 DIAGNOSIS — M109 Gout, unspecified: Secondary | ICD-10-CM | POA: Insufficient documentation

## 2018-02-08 DIAGNOSIS — D696 Thrombocytopenia, unspecified: Secondary | ICD-10-CM | POA: Insufficient documentation

## 2018-02-08 LAB — CBC WITH DIFFERENTIAL/PLATELET
BASOS ABS: 0.1 10*3/uL (ref 0–0.1)
BASOS PCT: 1 %
Eosinophils Absolute: 0.2 10*3/uL (ref 0–0.7)
Eosinophils Relative: 4 %
HEMATOCRIT: 36.6 % — AB (ref 40.0–52.0)
Hemoglobin: 11.6 g/dL — ABNORMAL LOW (ref 13.0–18.0)
LYMPHS PCT: 25 %
Lymphs Abs: 1.5 10*3/uL (ref 1.0–3.6)
MCH: 22.7 pg — ABNORMAL LOW (ref 26.0–34.0)
MCHC: 31.6 g/dL — ABNORMAL LOW (ref 32.0–36.0)
MCV: 72 fL — AB (ref 80.0–100.0)
Monocytes Absolute: 0.6 10*3/uL (ref 0.2–1.0)
Monocytes Relative: 9 %
NEUTROS ABS: 3.7 10*3/uL (ref 1.4–6.5)
NEUTROS PCT: 61 %
Platelets: 124 10*3/uL — ABNORMAL LOW (ref 150–440)
RBC: 5.09 MIL/uL (ref 4.40–5.90)
RDW: 16.2 % — ABNORMAL HIGH (ref 11.5–14.5)
WBC: 6 10*3/uL (ref 3.8–10.6)

## 2018-02-08 LAB — FERRITIN: Ferritin: 379 ng/mL — ABNORMAL HIGH (ref 24–336)

## 2018-02-08 LAB — IRON AND TIBC
Iron: 55 ug/dL (ref 45–182)
Saturation Ratios: 24 % (ref 17.9–39.5)
TIBC: 227 ug/dL — ABNORMAL LOW (ref 250–450)
UIBC: 172 ug/dL

## 2018-02-08 LAB — FOLATE: FOLATE: 12.5 ng/mL (ref >5.9)

## 2018-02-08 LAB — VITAMIN B12: Vitamin B-12: 191 pg/mL (ref 180–914)

## 2018-02-14 ENCOUNTER — Telehealth: Payer: Self-pay | Admitting: *Deleted

## 2018-02-14 ENCOUNTER — Ambulatory Visit: Payer: Medicare HMO

## 2018-02-14 MED ORDER — VITAMIN B-12 1000 MCG PO TABS: 1000.0000 ug | ORAL_TABLET | Freq: Every day | ORAL | 3 refills | Status: DC

## 2018-02-14 NOTE — Telephone Encounter (Signed)
-----   Message from Creig Hines, MD sent at 02/13/2018  8:03 AM EDT ----- Please see if patient is taking po b12. If not he needs to start taking 1000 mcg daily. If he is taking P, he needs monthly b12 shots. Thanks, Ovidio Kin

## 2018-02-14 NOTE — Telephone Encounter (Signed)
Called pt and asked him about if he is taking vitamin b12 over the counter vitamin and he said no. I told him that his b12 level was on low side and dr Smith Robert wanted him to start taking vitamin b12 1000 mcg daily and he can get it on [facility].  He says he won't remember what I said and I told him that I can put it in as rx but they will get it off the shelves.  He is fine with that and will pick it up at pharmacy and I sent rx in. Per Dr. Irven Easterly

## 2018-02-28 ENCOUNTER — Ambulatory Visit
Admission: RE | Admit: 2018-02-28 | Discharge: 2018-02-28 | Disposition: A | Discharge status: Home / Self Care | Payer: Medicare HMO | Source: Ambulatory Visit | Attending: Specialist | Admitting: Specialist

## 2018-02-28 DIAGNOSIS — R0609 Other forms of dyspnea: Secondary | ICD-10-CM | POA: Diagnosis present

## 2018-02-28 DIAGNOSIS — J84112 Idiopathic pulmonary fibrosis: Secondary | ICD-10-CM | POA: Insufficient documentation

## 2018-02-28 DIAGNOSIS — J479 Bronchiectasis, uncomplicated: Secondary | ICD-10-CM | POA: Diagnosis not present

## 2018-02-28 DIAGNOSIS — I2510 Atherosclerotic heart disease of native coronary artery without angina pectoris: Secondary | ICD-10-CM | POA: Insufficient documentation

## 2018-02-28 DIAGNOSIS — I7789 Other specified disorders of arteries and arterioles: Secondary | ICD-10-CM | POA: Insufficient documentation

## 2018-02-28 DIAGNOSIS — R911 Solitary pulmonary nodule: Secondary | ICD-10-CM | POA: Diagnosis not present

## 2018-03-11 ENCOUNTER — Other Ambulatory Visit: Payer: Self-pay | Admitting: Family

## 2018-04-13 ENCOUNTER — Other Ambulatory Visit: Payer: Self-pay | Admitting: Specialist

## 2018-04-13 DIAGNOSIS — J849 Interstitial pulmonary disease, unspecified: Secondary | ICD-10-CM

## 2018-04-16 ENCOUNTER — Encounter: Payer: Self-pay | Admitting: Family

## 2018-04-16 ENCOUNTER — Ambulatory Visit: Payer: Medicare HMO | Attending: Family | Admitting: Family

## 2018-04-16 VITALS — BP 89/52 | HR 73 | Resp 18 | Ht 68.0 in | Wt 140.4 lb

## 2018-04-16 DIAGNOSIS — N400 Enlarged prostate without lower urinary tract symptoms: Secondary | ICD-10-CM | POA: Insufficient documentation

## 2018-04-16 DIAGNOSIS — J8410 Pulmonary fibrosis, unspecified: Secondary | ICD-10-CM | POA: Diagnosis not present

## 2018-04-16 DIAGNOSIS — R634 Abnormal weight loss: Secondary | ICD-10-CM | POA: Insufficient documentation

## 2018-04-16 DIAGNOSIS — Z79899 Other long term (current) drug therapy: Secondary | ICD-10-CM | POA: Insufficient documentation

## 2018-04-16 DIAGNOSIS — Z7982 Long term (current) use of aspirin: Secondary | ICD-10-CM | POA: Diagnosis not present

## 2018-04-16 DIAGNOSIS — N189 Chronic kidney disease, unspecified: Secondary | ICD-10-CM | POA: Insufficient documentation

## 2018-04-16 DIAGNOSIS — Z950 Presence of cardiac pacemaker: Secondary | ICD-10-CM | POA: Diagnosis not present

## 2018-04-16 DIAGNOSIS — I495 Sick sinus syndrome: Secondary | ICD-10-CM | POA: Diagnosis not present

## 2018-04-16 DIAGNOSIS — D649 Anemia, unspecified: Secondary | ICD-10-CM | POA: Diagnosis not present

## 2018-04-16 DIAGNOSIS — I2510 Atherosclerotic heart disease of native coronary artery without angina pectoris: Secondary | ICD-10-CM | POA: Diagnosis not present

## 2018-04-16 DIAGNOSIS — I130 Hypertensive heart and chronic kidney disease with heart failure and stage 1 through stage 4 chronic kidney disease, or unspecified chronic kidney disease: Secondary | ICD-10-CM | POA: Diagnosis not present

## 2018-04-16 DIAGNOSIS — M109 Gout, unspecified: Secondary | ICD-10-CM | POA: Diagnosis not present

## 2018-04-16 DIAGNOSIS — Z87891 Personal history of nicotine dependence: Secondary | ICD-10-CM | POA: Diagnosis not present

## 2018-04-16 DIAGNOSIS — I5032 Chronic diastolic (congestive) heart failure: Secondary | ICD-10-CM | POA: Insufficient documentation

## 2018-04-16 DIAGNOSIS — I10 Essential (primary) hypertension: Secondary | ICD-10-CM

## 2018-04-16 NOTE — Progress Notes (Signed)
Patient ID: Derek Bernard, male    DOB: 10/30/1929, 84 y.o.   MRN: [id number]  HPI  Derek Bernard is an 84 y/o male with a history of anemia, BPH, CKD, gout, HTN, sick sinus syndrome (pacemaker), pulmonary fibrosis, remote tobacco use and chronic heart failure.   Echo report from 07/15/17 reviewed and showed an EF of 65% along with mild Derek/TR. He also has an elevated PA pressure of 51 mm Hg. Cardiac catheterization was done 12/23/15 which showed mild 3 vessel CAD.   He hasn't been admitted or been in the ED in the last 6 months.   He presents today for a follow-up visit with a chief complaint of minimal fatigue upon moderate exertion. He says that this has been present for several years. He has associated decreased appetite, cough, shortness of breath, wheezing and pedal edema along with this. He denies any difficulty sleeping, abdominal distention, palpitations, chest pain, dizziness or weight gain. Notes a gradual weight loss due to a decreased appetite. Wearing oxygen at 3L around the clock.  Past Medical History:  Diagnosis Date  . Anemia   . BPH (benign prostatic hyperplasia)   . CHF (congestive heart failure) (HCC)   . CKD (chronic kidney disease)   . Gout   . Hypertension   . Memory change   . Pacemaker 2010  . Pulmonary fibrosis (HCC)   . Sick sinus syndrome [facility](HCC)    s/p pacemaker  . Sinus bradycardia    Past Surgical History:  Procedure Laterality Date  . CARDIAC CATHETERIZATION N/A 12/23/2015   Procedure: Left Heart Cath and Coronary Angiography;  Surgeon: Lamar BlinksBruce J Kowalski, MD;  Location: [facility] INVASIVE CV LAB;  Service: Cardiovascular;  Laterality: N/A;  . CATARACT EXTRACTION     Right eye  . PACEMAKER INSERTION     No family history on file. Social History   Tobacco Use  . Smoking status: Former Smoker    Packs/day: 0.50    Years: 20.00    Pack years: 10.00    Types: Cigarettes    Last attempt to quit: 1985    Years since quitting: 34.5  . Smokeless tobacco:  Never Used  . Tobacco comment: Quit 20 years ago  Substance Use Topics  . Alcohol use: No    Alcohol/week: 0.0 oz   No Known Allergies  Prior to Admission medications   Medication Sig Start Date End Date Taking? Authorizing Provider  aspirin EC 81 MG tablet Take 81 mg by mouth daily.   Yes [provider]  atorvastatin (LIPITOR) 10 MG tablet Take 10 mg by mouth daily.   Yes [provider]  brimonidine (ALPHAGAN P) 0.1 % SOLN Place 1 drop into both eyes 3 (three) times daily.   Yes [provider]  budesonide-formoterol (SYMBICORT) 160-4.5 MCG/ACT inhaler Inhale 2 puffs into the lungs 2 (two) times daily. Pt was using on prn at night- educated on proper use   Yes [provider]  diclofenac sodium (VOLTAREN) 1 % GEL Apply 1 application topically daily. 04/11/18  Yes [provider]  donepezil (ARICEPT) 10 MG tablet Take 1 tablet by mouth daily. 03/21/18  Yes [provider]  dorzolamide-timolol (COSOPT) 22.3-6.8 MG/ML ophthalmic solution Place 1 drop into both eyes 2 (two) times daily.   Yes [provider]  fexofenadine (ALLEGRA) 180 MG tablet Take 180 mg by mouth daily. 03/21/18  Yes [provider]  fluticasone (FLONASE) 50 MCG/ACT nasal spray Place 2 sprays into both nostrils daily.  Yes [provider]  furosemide (LASIX) 40 MG tablet take 1 tablet by mouth twice a day 03/12/18  Yes Elzie Sheets A, FNP  IRON PO Take 1 tablet daily by mouth.   Yes [provider]  isoniazid (NYDRAZID) 300 MG tablet Take by mouth daily.   Yes [provider]  latanoprost (XALATAN) 0.005 % ophthalmic solution Place 1 drop into both eyes at bedtime.   Yes [provider]  montelukast (SINGULAIR) 10 MG tablet Take 10 mg by mouth daily.   Yes [provider]  pantoprazole (PROTONIX) 40 MG tablet Take 1 tablet by mouth daily. 03/21/18  Yes [provider]  potassium chloride (K-DUR) 10 MEQ  tablet Take 10 mEq by mouth daily. 03/21/18  Yes [provider]  predniSONE (DELTASONE) 10 MG tablet Take 1 tablet by mouth as directed. 04/13/18 04/19/18 Yes [provider]  traZODone (DESYREL) 100 MG tablet Take 100 mg by mouth at bedtime.   Yes [provider]  vitamin B-12 (CYANOCOBALAMIN) 1000 MCG tablet Take 1 tablet (1,000 mcg total) by mouth daily. 02/14/18  Yes Creig Hines, MD    Review of Systems  Constitutional: Positive for appetite change (decreased) and fatigue.  HENT: Positive for hearing loss. Negative for congestion and sore throat.   Eyes: Negative.   Respiratory: Positive for cough (productive sputum), shortness of breath and wheezing. Negative for chest tightness.   Cardiovascular: Positive for leg swelling (mininimal). Negative for chest pain and palpitations.  Gastrointestinal: Negative for abdominal distention and abdominal pain.  Endocrine: Negative.   Genitourinary: Negative.   Musculoskeletal: Negative for back pain and neck pain.  Skin: Negative.   Allergic/Immunologic: Negative.   Neurological: Negative for dizziness and light-headedness.  Hematological: Negative for adenopathy. Does not bruise/bleed easily.  Psychiatric/Behavioral: Negative for dysphoric mood and sleep disturbance (wearing oxygen @ 3L ). The patient is not nervous/anxious.    Vitals:   04/16/18 0912  BP: (!) 89/52  Pulse: 73  Resp: 18  SpO2: 97%  Weight: 140 lb 6 oz (63.7 kg)  Height: 5\' 8"  (1.727 m)   Wt Readings from Last 3 Encounters:  04/16/18 140 lb 6 oz (63.7 kg)  11/09/17 152 lb (68.9 kg)  10/23/17 157 lb 4 oz (71.3 kg)   Lab Results  Component Value Date   CREATININE 0.98 08/11/2017   CREATININE 1.48 (H) 07/16/2017   CREATININE 1.17 07/15/2017    Physical Exam  Constitutional: He is oriented to person, place, and time. He appears well-developed and well-nourished.  HENT:  Head: Normocephalic and atraumatic.  Right Ear: Decreased hearing is  noted.  Left Ear: Decreased hearing is noted.  Neck: Normal range of motion. Neck supple. No JVD present.  Cardiovascular: Normal rate and regular rhythm.  Pulmonary/Chest: Effort normal. He has no wheezes. He has no rales.  Abdominal: Soft. He exhibits no distension. There is no tenderness.  Musculoskeletal: He exhibits edema (trace pitting edema in bilateral lower legs). He exhibits no tenderness.  Neurological: He is alert and oriented to person, place, and time.  Skin: Skin is warm and dry.  Psychiatric: He has a normal mood and affect. His behavior is normal. Thought content normal.  Nursing note and vitals reviewed.  Assessment & Plan:  1: Chronic heart failure with preserved ejection fraction- - NYHA class II - euvolemic today  - weighing daily and says that his weight has gradual declined. Reminded to call for an overnight weight gain of >2 pounds or a weekly  weight gain of >5 pounds - weight down 17 pounds in the last 6 months - not adding salt and friend with him says food isn't cooked with it either. Reviewed the importance of reading food labels so that he can maintain daily sodium intake to 2000mg  daily. - saw cardiologist Welton Flakes) 07/21/17  2: HTN- - BP low today but without dizziness - could consider decreasing furosemide but edema could worsen - BMP from 08/11/17 reviewed and showed sodium 136, potassium 4.4 and GFR >60  3: Pulmonary fibrosis- - wearing oxygen at 3L around the clock (was previously @ 2L) - saw pulmonologist Meredeth Ide) 04/13/18 and returns November - PFT's done 02/12/18  4: Weight loss- - has lost 17 pounds in the last 6 months by our scale - patient says that his appetite is decreased and he eats because he has to - recently given prednisone taper - encouraged him to drink ensure or boost 1-2 times a day in between meals  Patient did not bring his medications nor a list. Each medication was verbally reviewed with the patient and he was encouraged to  bring the bottles to every visit to confirm accuracy of list.   Return in 6 months or sooner for any questions/problems before then.

## 2018-04-16 NOTE — Patient Instructions (Addendum)
Continue weighing daily and call for an overnight weight gain of > 2 pounds or a weekly weight gain of >5 pounds.  Drink 1-2 cans of Ensure/ Boost daily.   Bring medications to every visit

## 2018-05-08 ENCOUNTER — Other Ambulatory Visit: Payer: Self-pay | Admitting: Oncology

## 2018-05-10 ENCOUNTER — Encounter: Payer: Self-pay | Admitting: Oncology

## 2018-05-10 ENCOUNTER — Inpatient Hospital Stay (HOSPITAL_BASED_OUTPATIENT_CLINIC_OR_DEPARTMENT_OTHER): Payer: Medicare HMO | Admitting: Oncology

## 2018-05-10 ENCOUNTER — Inpatient Hospital Stay: Payer: Medicare HMO | Attending: Oncology

## 2018-05-10 VITALS — BP 103/61 | HR 65 | Temp 97.7°F | Resp 18 | Ht 68.0 in | Wt 139.3 lb

## 2018-05-10 DIAGNOSIS — R7989 Other specified abnormal findings of blood chemistry: Secondary | ICD-10-CM | POA: Diagnosis not present

## 2018-05-10 DIAGNOSIS — D509 Iron deficiency anemia, unspecified: Secondary | ICD-10-CM | POA: Diagnosis not present

## 2018-05-10 DIAGNOSIS — D638 Anemia in other chronic diseases classified elsewhere: Secondary | ICD-10-CM

## 2018-05-10 DIAGNOSIS — D649 Anemia, unspecified: Secondary | ICD-10-CM

## 2018-05-10 LAB — CBC WITH DIFFERENTIAL/PLATELET
Basophils Absolute: 0 10*3/uL (ref 0–0.1)
Basophils Relative: 0 %
EOS ABS: 0.2 10*3/uL (ref 0–0.7)
Eosinophils Relative: 4 %
HEMATOCRIT: 37 % — AB (ref 40.0–52.0)
HEMOGLOBIN: 11.4 g/dL — AB (ref 13.0–18.0)
Lymphocytes Relative: 33 %
Lymphs Abs: 1.6 10*3/uL (ref 1.0–3.6)
MCH: 22.6 pg — ABNORMAL LOW (ref 26.0–34.0)
MCHC: 30.7 g/dL — AB (ref 32.0–36.0)
MCV: 73.8 fL — ABNORMAL LOW (ref 80.0–100.0)
MONOS PCT: 10 %
Monocytes Absolute: 0.5 10*3/uL (ref 0.2–1.0)
NEUTROS ABS: 2.5 10*3/uL (ref 1.4–6.5)
NEUTROS PCT: 53 %
Platelets: 178 10*3/uL (ref 150–440)
RBC: 5.02 MIL/uL (ref 4.40–5.90)
RDW: 15.7 % — ABNORMAL HIGH (ref 11.5–14.5)
WBC: 4.9 10*3/uL (ref 3.8–10.6)

## 2018-05-10 LAB — FERRITIN: Ferritin: 568 ng/mL — ABNORMAL HIGH (ref 24–336)

## 2018-05-10 LAB — IRON AND TIBC
Iron: 52 ug/dL (ref 45–182)
Saturation Ratios: 22 % (ref 17.9–39.5)
TIBC: 234 ug/dL — ABNORMAL LOW (ref 250–450)
UIBC: 182 ug/dL

## 2018-05-10 LAB — FOLATE: FOLATE: 16.1 ng/mL (ref >5.9)

## 2018-05-10 NOTE — Progress Notes (Signed)
No new changes noted today 

## 2018-05-11 NOTE — Progress Notes (Signed)
Hematology/Oncology Consult note [facility]  [phone] [phone]  Patient Care Team: Sherron Monday, MD as PCP - General (Internal Medicine) Benita Gutter, RN as Registered Nurse Delma Freeze, FNP as Nurse Practitioner (Family Medicine) Laurier Nancy, MD as Consulting Physician (Cardiology)   Name of the patient: Derek Bernard  [id number]  08/03/1930   Date of visit: 05/11/18  Diagnosis- microcytic anemia likely secondary to anemia of chronic disease along with some component of iron deficiency  Chief complaint/ Reason for visit- routine f/u of anemia  Heme/Onc history:  patient is a 84 year old male with a past medical history significant for dementia, hyperlipidemia, GERD, and other medical problems. There was an ongoing concern about loss when Dr. Ellsworth Lennox saw him recently. Patient states that he may have lost about 10 pounds of weight over the last couple of years but he is not sure. There was also concern about GI bleed however patient denies any blood in his sputum or urine. CBC checked on 12/30/2016 showed white count of 4, H&H of 11.6/39.4 with an MCV of 74 and platelet count of 119. BMP and liver functions were within normal limits.Patient had a CEA checked which was mildly elevated at 3 and subsequent CT chest abdomen and pelvis did not reveal any evidence of malignant he also had a 125 CA 2729 checked which were elevated at 51.5 and 48.7 respectively.He has been referred to [place] for suspected malignancy.  Patient lives alone and is independent of his ADLs and IADLs. He is able to drive. He does not know much about his medical history and medications that he is on. He is you are with his son today who is also not aware off his medications and medical problems in detail. Overall patient reports feeling well and denies any pain, fatigue.  Patient states that he has been seen by [facility] in the past and has  undergone a colonoscopy about 7 years ago  Ct chest abdomen and plevis done by pcp in April 2018 showed no evidence of malignany  With regards to workup for microcytosis: Iron studies were within normal limits except for a mildly low iron saturation of 15%.  Ferritin was elevated and TIBC was normal.  Hemoglobinopathy evaluation revealed a normal adult hemoglobin pattern.  B12 and folate were within normal limits   Interval history-patient is here with caregiver who reports that overall he is doing well and is at his baseline.  No recent hospitalization.  He has baseline fatigue and shortness of breath  ECOG PS- 3 Pain scale- 0 Opioid associated constipation- no  Review of systems- Review of Systems  Constitutional: Positive for malaise/fatigue. Negative for chills, fever and weight loss.  HENT: Negative for congestion, ear discharge and nosebleeds.   Eyes: Negative for blurred vision.  Respiratory: Positive for shortness of breath. Negative for cough, hemoptysis, sputum production and wheezing.   Cardiovascular: Negative for chest pain, palpitations, orthopnea and claudication.  Gastrointestinal: Negative for abdominal pain, blood in stool, constipation, diarrhea, heartburn, melena, nausea and vomiting.  Genitourinary: Negative for dysuria, flank pain, frequency, hematuria and urgency.  Musculoskeletal: Negative for back pain, joint pain and myalgias.  Skin: Negative for rash.  Neurological: Negative for dizziness, tingling, focal weakness, seizures, weakness and headaches.  Endo/Heme/Allergies: Does not bruise/bleed easily.  Psychiatric/Behavioral: Negative for depression and suicidal ideas. The patient does not have insomnia.       No Known Allergies   Past Medical History:  Diagnosis Date  . Anemia   . BPH (benign prostatic hyperplasia)   . CHF (congestive heart failure) (HCC)   . CKD (chronic kidney disease)   . Gout   . Hypertension   . Memory change   . Pacemaker  2010  . Pulmonary fibrosis (HCC)   . Sick sinus syndrome [facility](HCC)    s/p pacemaker  . Sinus bradycardia      Past Surgical History:  Procedure Laterality Date  . CARDIAC CATHETERIZATION N/A 12/23/2015   Procedure: Left Heart Cath and Coronary Angiography;  Surgeon: Lamar BlinksBruce J Kowalski, MD;  Location: [facility] INVASIVE CV LAB;  Service: Cardiovascular;  Laterality: N/A;  . CATARACT EXTRACTION     Right eye  . PACEMAKER INSERTION      Social History   Socioeconomic History  . Marital status: Widowed    Spouse name: Not on file  . Number of children: 11  . Years of education: 3  . Highest education level: 3rd grade  Occupational History  . Not on file  Social Needs  . Financial resource strain: Not hard at all  . Food insecurity:    Worry: Never true    Inability: Never true  . Transportation needs:    Medical: No    Non-medical: No  Tobacco Use  . Smoking status: Former Smoker    Packs/day: 0.50    Years: 20.00    Pack years: 10.00    Types: Cigarettes    Last attempt to quit: 1985    Years since quitting: 34.6  . Smokeless tobacco: Never Used  . Tobacco comment: Quit 20 years ago  Substance and Sexual Activity  . Alcohol use: No    Alcohol/week: 0.0 standard drinks  . Drug use: No  . Sexual activity: Never  Lifestyle  . Physical activity:    Days per week: 2 days    Minutes per session: 10 min  . Stress: Not at all  Relationships  . Social connections:    Talks on phone: More than three times a week    Gets together: More than three times a week    Attends religious service: More than 4 times per year    Active member of club or organization: Yes    Attends meetings of clubs or organizations: More than 4 times per year    Relationship status: Widowed  . Intimate partner violence:    Fear of current or ex partner: Patient refused    Emotionally abused: Patient refused    Physically abused: Patient refused    Forced sexual activity: Patient refused  Other Topics  Concern  . Not on file  Social History Narrative   Independent, lives at home by himself.    History reviewed. No pertinent family history.   Current Outpatient Medications:  .  celecoxib (CELEBREX) 200 MG capsule, Take 200 mg by mouth daily., Disp: , Rfl:  .  aspirin EC 81 MG tablet, Take 81 mg by mouth daily., Disp: , Rfl:  .  atorvastatin (LIPITOR) 10 MG tablet, Take 10 mg by mouth daily., Disp: , Rfl:  .  brimonidine (ALPHAGAN P) 0.1 % SOLN, Place 1 drop into both eyes 3 (three) times daily., Disp: , Rfl:  .  budesonide-formoterol (SYMBICORT) 160-4.5 MCG/ACT inhaler, Inhale 2 puffs into the lungs 2 (two) times daily. Pt was using on prn at night- educated on proper use, Disp: , Rfl:  .  diclofenac sodium (VOLTAREN) 1 % GEL, Apply 1 application topically daily., Disp: ,  Rfl: 0 .  donepezil (ARICEPT) 10 MG tablet, Take 1 tablet by mouth daily., Disp: , Rfl: 0 .  dorzolamide-timolol (COSOPT) 22.3-6.8 MG/ML ophthalmic solution, Place 1 drop into both eyes 2 (two) times daily., Disp: , Rfl:  .  fexofenadine (ALLEGRA) 180 MG tablet, Take 180 mg by mouth daily., Disp: , Rfl: 0 .  fluticasone (FLONASE) 50 MCG/ACT nasal spray, Place 2 sprays into both nostrils daily., Disp: , Rfl:  .  furosemide (LASIX) 40 MG tablet, take 1 tablet by mouth twice a day, Disp: 60 tablet, Rfl: 5 .  IRON PO, Take 1 tablet daily by mouth., Disp: , Rfl:  .  isoniazid (NYDRAZID) 300 MG tablet, Take by mouth daily., Disp: , Rfl:  .  latanoprost (XALATAN) 0.005 % ophthalmic solution, Place 1 drop into both eyes at bedtime., Disp: , Rfl:  .  montelukast (SINGULAIR) 10 MG tablet, Take 10 mg by mouth daily., Disp: , Rfl:  .  pantoprazole (PROTONIX) 40 MG tablet, Take 1 tablet by mouth daily., Disp: , Rfl: 0 .  potassium chloride (K-DUR) 10 MEQ tablet, Take 10 mEq by mouth daily., Disp: , Rfl: 0 .  traZODone (DESYREL) 100 MG tablet, Take 100 mg by mouth at bedtime., Disp: , Rfl:  .  vitamin B-12 (CYANOCOBALAMIN) 1000 MCG  tablet, take 1 tablet by mouth once daily, Disp: 30 tablet, Rfl: 3  Physical exam:  Vitals:   05/10/18 1404  BP: 103/61  Pulse: 65  Resp: 18  Temp: 97.7 F (36.5 C)  TempSrc: Tympanic  SpO2: 97%  Weight: 139 lb 4.8 oz (63.2 kg)  Height: 5\' 8"  (1.727 m)   Physical Exam  Constitutional: He is oriented to person, place, and time.  Thin frail elderly gentleman sitting in a wheelchair.  He is on home oxygen.  Does not appear to be in any acute distress  HENT:  Head: Normocephalic and atraumatic.  Eyes: Pupils are equal, round, and reactive to light. EOM are normal.  Neck: Normal range of motion.  Cardiovascular: Normal rate, regular rhythm and normal heart sounds.  Pulmonary/Chest: Effort normal and breath sounds normal.  Abdominal: Soft. Bowel sounds are normal.  Neurological: He is alert and oriented to person, place, and time.  Skin: Skin is warm and dry.     CMP Latest Ref Rng & Units 08/11/2017  Glucose 65 - 99 mg/dL 98  BUN 6 - 20 mg/dL 14  Creatinine 1.47 - 8.29 mg/dL 5.62  Sodium 130 - 865 mmol/L 136  Potassium 3.5 - 5.1 mmol/L 4.4  Chloride 101 - 111 mmol/L 100(L)  CO2 22 - 32 mmol/L 28  Calcium 8.9 - 10.3 mg/dL 9.0  Total Protein 6.5 - 8.1 g/dL -  Total Bilirubin 0.3 - 1.2 mg/dL -  Alkaline Phos 38 - 784 U/L -  AST 15 - 41 U/L -  ALT 17 - 63 U/L -   CBC Latest Ref Rng & Units 05/10/2018  WBC 3.8 - 10.6 K/uL 4.9  Hemoglobin 13.0 - 18.0 g/dL 11.4(L)  Hematocrit 40.0 - 52.0 % 37.0(L)  Platelets 150 - 440 K/uL 178      Assessment and plan- Patient is a 84 y.o. male with microcytic anemia likely secondary to anemia of chronic disease with some component of iron deficiency.  Patient's hemoglobin is stable at 11.4 with a normal white count and a normal platelet count.  He does have chronic microcytosis unrelated to his iron deficiency.  Iron studies are indicative of anemia of chronic disease.  I will see him back in 6 months with CBC ferritin and iron studies as  well as B12 levels.   Visit Diagnosis 1. Microcytic anemia   2. Anemia of chronic disease      Dr. Owens Shark, MD, MPH [facility] [phone] 05/11/2018 10:13 AM

## 2018-06-01 ENCOUNTER — Encounter: Payer: Self-pay | Admitting: Emergency Medicine

## 2018-06-01 ENCOUNTER — Emergency Department
Admission: EM | Admit: 2018-06-01 | Discharge: 2018-06-01 | Disposition: A | Discharge status: Home / Self Care | Payer: Medicare HMO | Attending: Emergency Medicine | Admitting: Emergency Medicine

## 2018-06-01 ENCOUNTER — Emergency Department: Payer: Medicare HMO

## 2018-06-01 DIAGNOSIS — I130 Hypertensive heart and chronic kidney disease with heart failure and stage 1 through stage 4 chronic kidney disease, or unspecified chronic kidney disease: Secondary | ICD-10-CM | POA: Diagnosis not present

## 2018-06-01 DIAGNOSIS — I509 Heart failure, unspecified: Secondary | ICD-10-CM | POA: Diagnosis not present

## 2018-06-01 DIAGNOSIS — I110 Hypertensive heart disease with heart failure: Secondary | ICD-10-CM | POA: Insufficient documentation

## 2018-06-01 DIAGNOSIS — Z950 Presence of cardiac pacemaker: Secondary | ICD-10-CM | POA: Insufficient documentation

## 2018-06-01 DIAGNOSIS — F17210 Nicotine dependence, cigarettes, uncomplicated: Secondary | ICD-10-CM | POA: Diagnosis not present

## 2018-06-01 DIAGNOSIS — Z7982 Long term (current) use of aspirin: Secondary | ICD-10-CM | POA: Diagnosis not present

## 2018-06-01 DIAGNOSIS — R0602 Shortness of breath: Secondary | ICD-10-CM | POA: Diagnosis present

## 2018-06-01 DIAGNOSIS — J8410 Pulmonary fibrosis, unspecified: Secondary | ICD-10-CM | POA: Insufficient documentation

## 2018-06-01 DIAGNOSIS — N189 Chronic kidney disease, unspecified: Secondary | ICD-10-CM | POA: Insufficient documentation

## 2018-06-01 DIAGNOSIS — Z79899 Other long term (current) drug therapy: Secondary | ICD-10-CM | POA: Insufficient documentation

## 2018-06-01 LAB — CBC
HEMATOCRIT: 35.8 % — AB (ref 40.0–52.0)
Hemoglobin: 11.3 g/dL — ABNORMAL LOW (ref 13.0–18.0)
MCH: 23.1 pg — ABNORMAL LOW (ref 26.0–34.0)
MCHC: 31.5 g/dL — AB (ref 32.0–36.0)
MCV: 73.3 fL — AB (ref 80.0–100.0)
Platelets: 139 10*3/uL — ABNORMAL LOW (ref 150–440)
RBC: 4.88 MIL/uL (ref 4.40–5.90)
RDW: 17.3 % — ABNORMAL HIGH (ref 11.5–14.5)
WBC: 6.3 10*3/uL (ref 3.8–10.6)

## 2018-06-01 LAB — BASIC METABOLIC PANEL
ANION GAP: 5 (ref 5–15)
BUN: 14 mg/dL (ref 8–23)
CHLORIDE: 109 mmol/L (ref 98–111)
CO2: 24 mmol/L (ref 22–32)
CREATININE: 0.79 mg/dL (ref 0.61–1.24)
Calcium: 8.5 mg/dL — ABNORMAL LOW (ref 8.9–10.3)
GFR calc non Af Amer: >60 mL/min (ref >60)
Glucose, Bld: 154 mg/dL — ABNORMAL HIGH (ref 70–99)
Potassium: 4.1 mmol/L (ref 3.5–5.1)
Sodium: 138 mmol/L (ref 135–145)

## 2018-06-01 LAB — TROPONIN I: Troponin I: 0.12 ng/mL (ref <0.03)

## 2018-06-01 MED ORDER — ALBUTEROL SULFATE (2.5 MG/3ML) 0.083% IN NEBU
5.0000 mg | INHALATION_SOLUTION | Freq: Once | RESPIRATORY_TRACT | Status: AC
  Administered 2018-06-01: 5 mg via RESPIRATORY_TRACT
  Filled 2018-06-01: qty 6

## 2018-06-01 NOTE — ED Notes (Signed)
ED Provider at bedside. 

## 2018-06-01 NOTE — ED Notes (Signed)
Date and time results received: 06/01/18 6:53 PM    Test: troponin Critical Value: 0.12  Name of Provider Notified: Paduchowski

## 2018-06-01 NOTE — ED Provider Notes (Signed)
[facility] Emergency Department Provider Note  Time seen: 6:15 PM  I have reviewed the triage vital signs and the nursing notes.   HISTORY  Chief Complaint Shortness of Breath    HPI Derek Bernard is a 84 y.o. male with a past medical history of anemia, CHF, CKD, pulmonary fibrosis sees Dr. Meredeth IdeFleming, presents to the emergency department for shortness of breath.  According to the patient he has had somewhat worsening shortness of breath over the past many weeks, family states it has been worse over the past 3 to 4 days in which she is becoming more short of breath with exertion.  Patient denies any chest pain.  Currently he states he feels fine denies any shortness of breath.  Wears 4 L of oxygen 24/7 currently satting 100% on 4 L.  Patient does state mild cough with congestion but states that is largely chronic.  Denies any fever.   Past Medical History:  Diagnosis Date  . Anemia   . BPH (benign prostatic hyperplasia)   . CHF (congestive heart failure) (HCC)   . CKD (chronic kidney disease)   . Gout   . Hypertension   . Memory change   . Pacemaker 2010  . Pulmonary fibrosis (HCC)   . Sick sinus syndrome [facility](HCC)    s/p pacemaker  . Sinus bradycardia     Patient Active Problem List   Diagnosis Date Noted  . Weight loss 04/16/2018  . HTN (hypertension) 07/24/2017  . CHF (congestive heart failure) (HCC) 07/14/2017  . Cardiac pacemaker 07/04/2016  . Tendinitis 12/23/2015  . SSS (sick sinus syndrome) (HCC) 06/25/2015  . COPD (chronic obstructive pulmonary disease) (HCC) 03/28/2014  . Pulmonary fibrosis (HCC) 03/28/2014    Past Surgical History:  Procedure Laterality Date  . CARDIAC CATHETERIZATION N/A 12/23/2015   Procedure: Left Heart Cath and Coronary Angiography;  Surgeon: Lamar BlinksBruce J Kowalski, MD;  Location: [facility] INVASIVE CV LAB;  Service: Cardiovascular;  Laterality: N/A;  . CATARACT EXTRACTION     Right eye  . PACEMAKER INSERTION      Prior to  Admission medications   Medication Sig Start Date End Date Taking? Authorizing Provider  aspirin EC 81 MG tablet Take 81 mg by mouth daily.   Yes [provider]  atorvastatin (LIPITOR) 10 MG tablet Take 10 mg by mouth daily.   Yes [provider]  brimonidine (ALPHAGAN P) 0.1 % SOLN Place 1 drop into both eyes 3 (three) times daily.   Yes [provider]  budesonide-formoterol (SYMBICORT) 160-4.5 MCG/ACT inhaler Inhale 2 puffs into the lungs 2 (two) times daily. Pt was using on prn at night- educated on proper use   Yes [provider]  celecoxib (CELEBREX) 200 MG capsule Take 200 mg by mouth daily.   Yes [provider]  diclofenac sodium (VOLTAREN) 1 % GEL Apply 1 application topically daily. 04/11/18  Yes [provider]  donepezil (ARICEPT) 10 MG tablet Take 1 tablet by mouth daily. 03/21/18  Yes [provider]  dorzolamide-timolol (COSOPT) 22.3-6.8 MG/ML ophthalmic solution Place 1 drop into both eyes 2 (two) times daily.   Yes [provider]  fexofenadine (ALLEGRA) 180 MG tablet Take 180 mg by mouth daily. 03/21/18  Yes [provider]  fluticasone (FLONASE) 50 MCG/ACT nasal spray Place 2 sprays into both nostrils daily.   Yes [provider]  furosemide (LASIX) 40 MG tablet take 1 tablet by mouth twice a day Patient taking differently: Take 20 mg  by mouth 2 (two) times daily.  03/12/18  Yes Hackney, Tina A, FNP  IRON PO Take 1 tablet daily by mouth.   Yes [provider]  isoniazid (NYDRAZID) 300 MG tablet Take by mouth daily.   Yes [provider]  latanoprost (XALATAN) 0.005 % ophthalmic solution Place 1 drop into both eyes at bedtime.   Yes [provider]  montelukast (SINGULAIR) 10 MG tablet Take 10 mg by mouth daily.   Yes [provider]  pantoprazole (PROTONIX) 40 MG tablet Take 1 tablet by mouth daily. 03/21/18  Yes [provider]  potassium chloride  (K-DUR) 10 MEQ tablet Take 10 mEq by mouth daily. 03/21/18  Yes [provider]  vitamin B-12 (CYANOCOBALAMIN) 1000 MCG tablet take 1 tablet by mouth once daily 05/08/18  Yes Creig Hines, MD    No Known Allergies  History reviewed. No pertinent family history.  Social History Social History   Tobacco Use  . Smoking status: Former Smoker    Packs/day: 0.50    Years: 20.00    Pack years: 10.00    Types: Cigarettes    Last attempt to quit: 1985    Years since quitting: 34.6  . Smokeless tobacco: Never Used  . Tobacco comment: Quit 20 years ago  Substance Use Topics  . Alcohol use: No    Alcohol/week: 0.0 standard drinks  . Drug use: No    Review of Systems Constitutional: Negative for fever. ENT: Congestion Cardiovascular: Negative for chest pain. Respiratory: Shortness of breath with exertion.  Mild cough. Gastrointestinal: Negative for abdominal pain, vomiting and diarrhea. Genitourinary: Negative for urinary compaints Musculoskeletal: Negative for leg pain or swelling Skin: Negative for skin complaints  Neurological: Negative for headache All other ROS negative  ____________________________________________   PHYSICAL EXAM:  VITAL SIGNS: ED Triage Vitals  Enc Vitals Group     BP 06/01/18 1636 (!) 119/57     Pulse Rate 06/01/18 1636 68     Resp 06/01/18 1636 20     Temp 06/01/18 1636 97.6 F (36.4 C)     Temp Source 06/01/18 1636 Oral     SpO2 06/01/18 1636 95 %     Weight 06/01/18 1637 137 lb (62.1 kg)     Height 06/01/18 1637 5\' 7"  (1.702 m)     Head Circumference --      Peak Flow --      Pain Score 06/01/18 1637 0     Pain Loc --      Pain Edu? --      Excl. in GC? --    Constitutional: Alert and oriented. Well appearing and in no distress. Eyes: Normal exam ENT   Head: Normocephalic and atraumatic.   Mouth/Throat: Mucous membranes are moist. Cardiovascular: Normal rate, regular rhythm. No murmur Respiratory: Normal respiratory  effort without tachypnea nor retractions. Breath sounds are clear Gastrointestinal: Soft and nontender. No distention.  Musculoskeletal: Nontender with normal range of motion in all extremities. No lower extremity tenderness or edema. Neurologic:  Normal speech and language. No gross focal neurologic deficits Skin:  Skin is warm, dry and intact.  Psychiatric: Mood and affect are normal.  ____________________________________________    EKG  EKG reviewed and interpreted by myself shows what appears to be an atrial paced rhythm at 62 bpm with a borderline widened QRS, normal axis, nonspecific but no concerning ST changes.  ____________________________________________    RADIOLOGY  Chest x-ray largely unchanged  ____________________________________________   INITIAL IMPRESSION / ASSESSMENT  AND PLAN / ED COURSE  Pertinent labs & imaging results that were available during my care of the patient were reviewed by me and considered in my medical decision making (see chart for details).  Patient presents to the emergency department for shortness of breath worse over the past 3 days.  Currently denies any shortness of breath states he feels normal.  Patient states a mild cough and congestion.  Chest x-ray is unchanged.  Differential would include pulmonary fibrosis, progressive lung disease, pneumonia, URI, ACS.  We will check labs including cardiac panel.  Reassuringly chest x-ray appears stable no concerning findings on EKG.  Patient's labs are largely at his baseline.  Troponin is slightly elevated however reviewing the labs from the last several years this is his baseline.  Patient states he still feels well and wishes to go home.  We will discharge with pulmonary follow-up.  Patient and family agreeable to plan of care.  ____________________________________________   FINAL CLINICAL IMPRESSION(S) / ED DIAGNOSES  Dyspnea on exertion    Minna Antis, MD 06/01/18 1910

## 2018-06-01 NOTE — ED Triage Notes (Signed)
Pt to ED with c/o of SOB that has increased since Wed. Pt recently had chest xray that was negative.

## 2018-09-02 DEATH — deceased

## 2018-10-17 ENCOUNTER — Ambulatory Visit: Payer: Medicare HMO | Admitting: Family

## 2018-11-09 ENCOUNTER — Ambulatory Visit: Payer: Medicare HMO | Admitting: Oncology

## 2018-11-09 ENCOUNTER — Other Ambulatory Visit: Payer: Medicare HMO

## 2019-01-07 IMAGING — CR DG CHEST 2V
1 series · 2 of 2 positions shown · non-contrast
Comparison: 04/23/2016 CT and 12/22/2015 chest x-ray

CLINICAL DATA: Pt reports that he has had some Shortness of breath
that started yesterday. Per EMS he was 70% on room air, he was able
to go up to the high 90's on 4L [HOSPITAL].

EXAM:
CHEST  2 VIEW

[Series 1: w chest pa · 0.14mm/px · 2 of 2 slices shown]
[im 1/2]
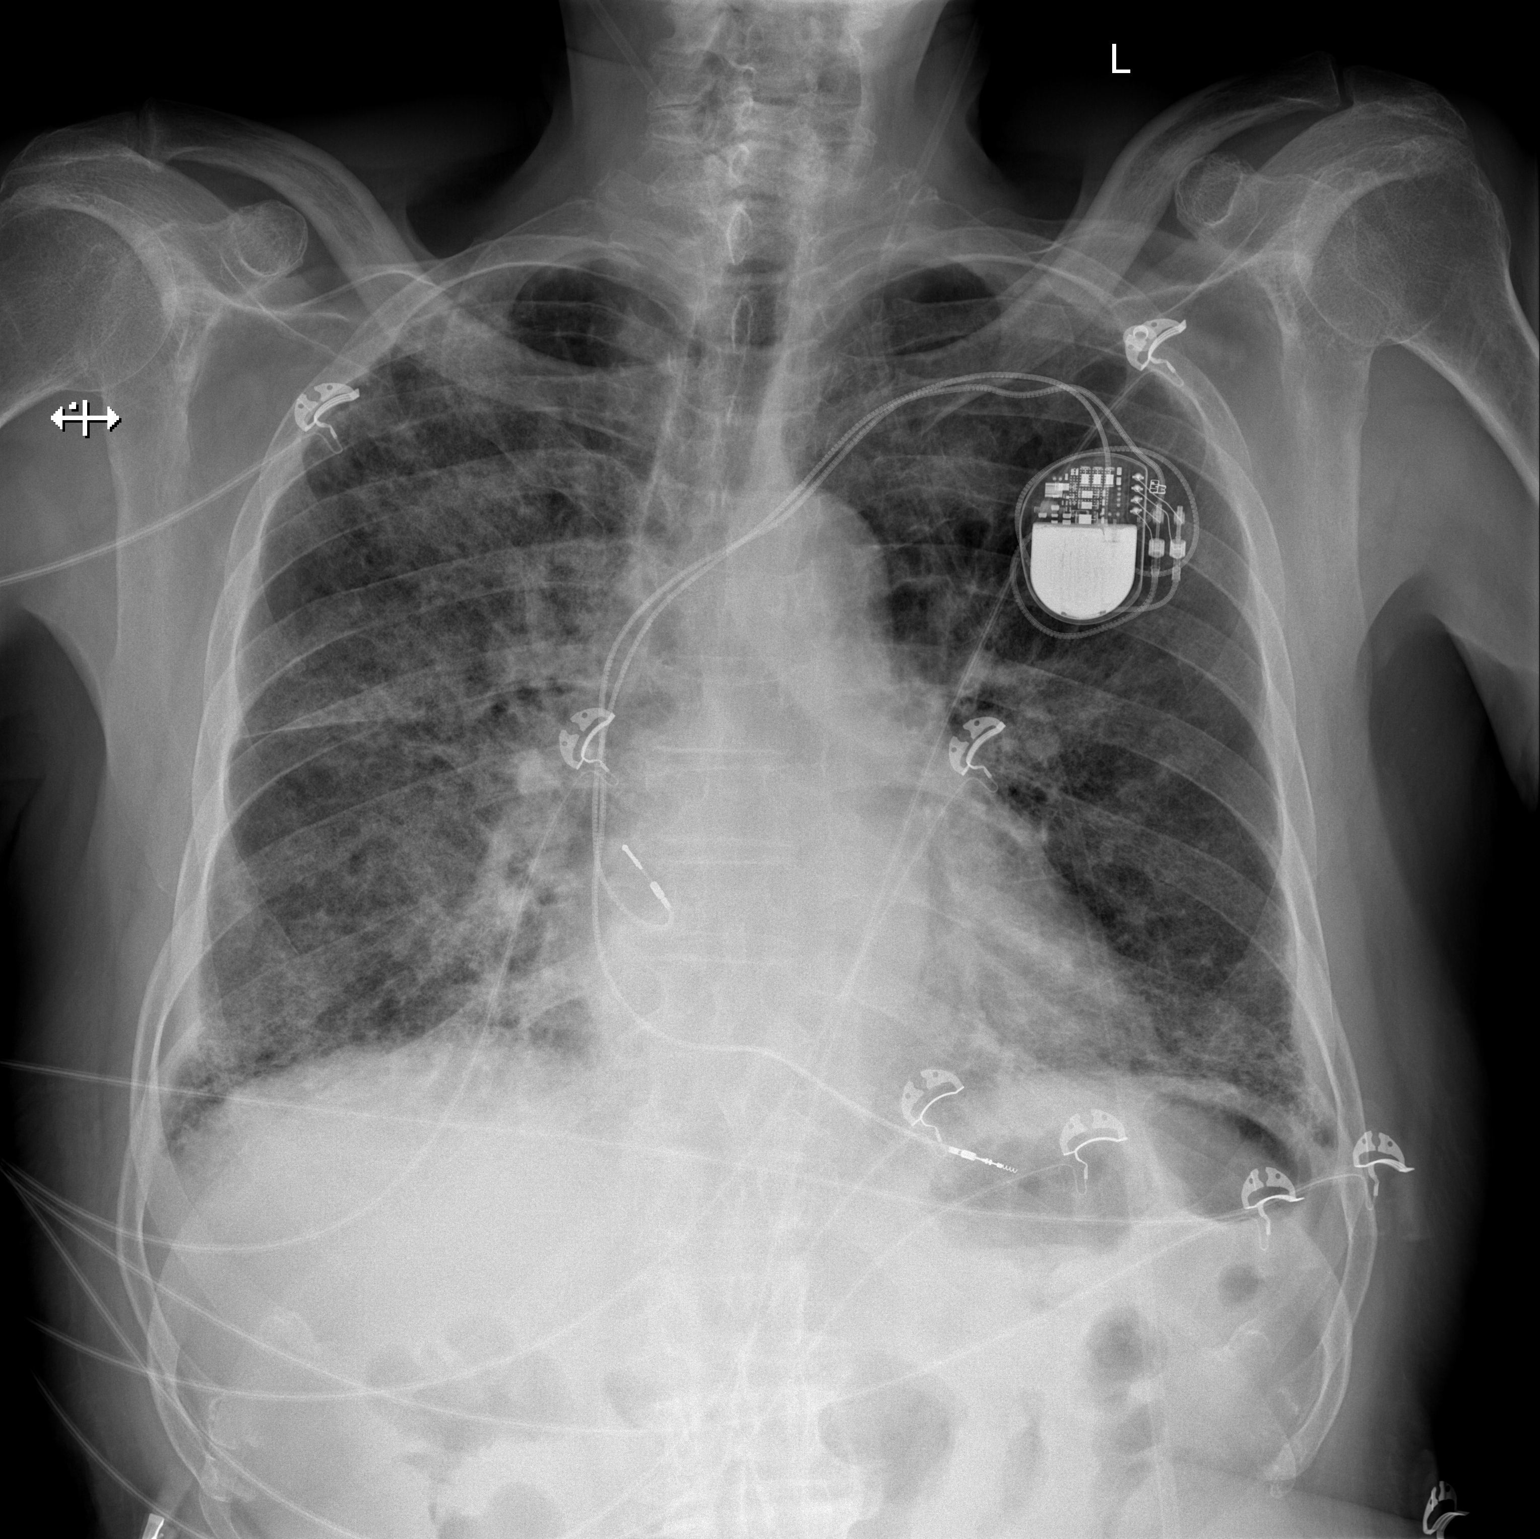
[im 2/2]
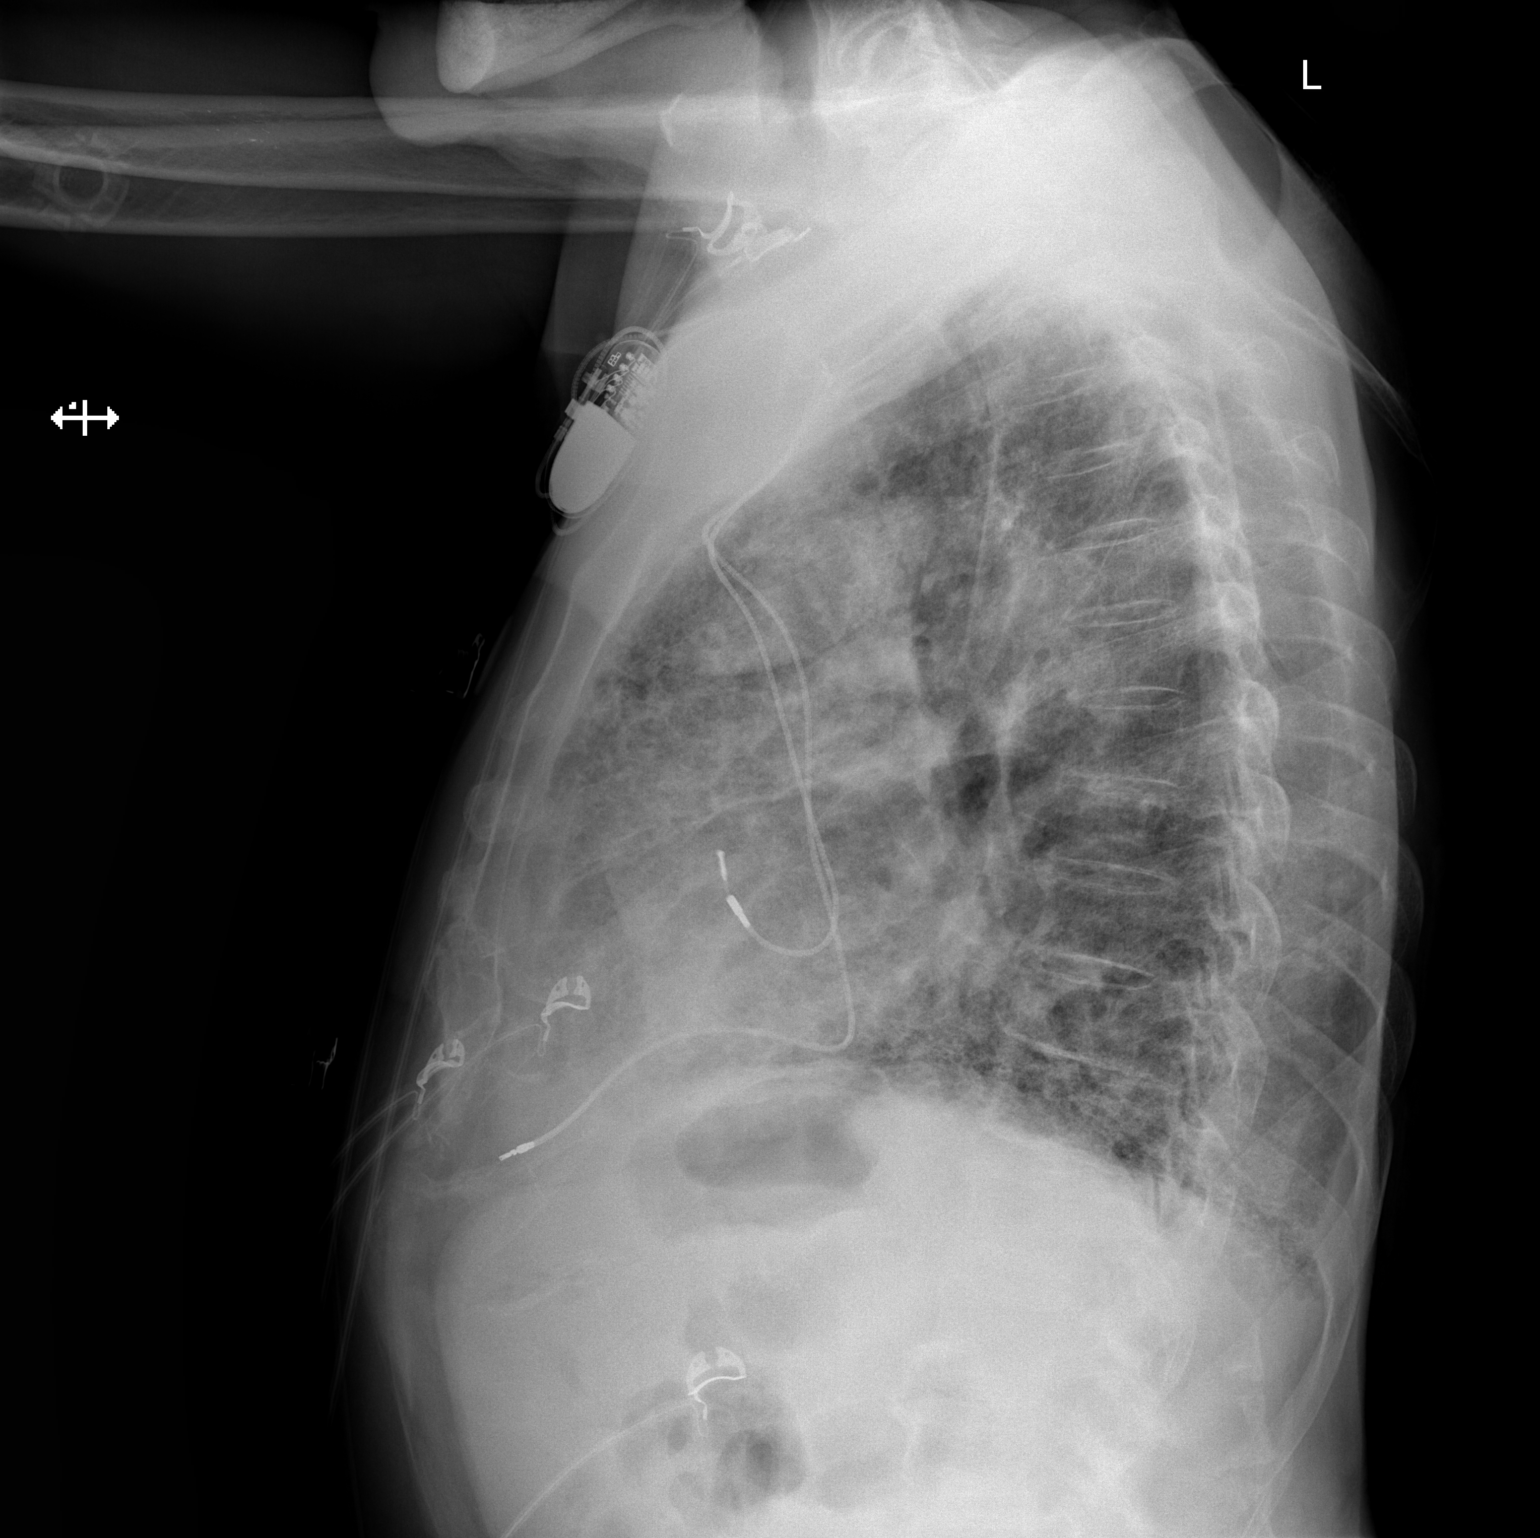

[2 of 2 positions shown; findings below may reference images not displayed]

FINDINGS: Left-sided transvenous pacemaker with leads to the right atrium and
right ventricle. The heart size is normal. There are patchy
infiltrates within the lungs bilaterally, right greater than left
and increased since prior study and compatible with asymmetric
pulmonary edema or infectious infiltrates.
IMPRESSION: Increased diffuse asymmetric infiltrates are compatible with
asymmetric pulmonary edema or infectious cause.

## 2019-08-24 IMAGING — CT CT CHEST W/O CM
2 of 4 series · 14 of 36 positions shown, 17 images · non-contrast
Comparison: Chest CT 04/23/2016 and 12/29/2015. Radiographs
07/14/2017.

CLINICAL DATA: Chronic shortness of breath, worsening over the last
week. History of pulmonary fibrosis, congestive heart failure and
chronic kidney disease. Former smoker.

EXAM:
CT CHEST WITHOUT CONTRAST
TECHNIQUE: Multidetector CT imaging of the chest was performed following the
standard protocol without IV contrast.

[Series 2: chest · axial · 0.69mm/px · z∈[-1297,-1061]mm · 11 of 140 slices shown, 14 images (1 of 2)]
[im 11/140  mediastinal]
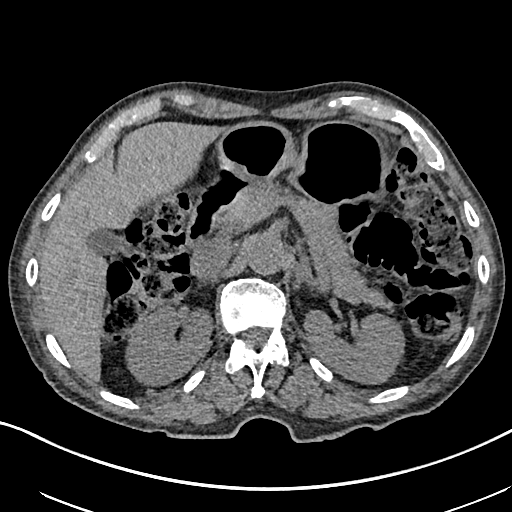
[im 11/140  lung]
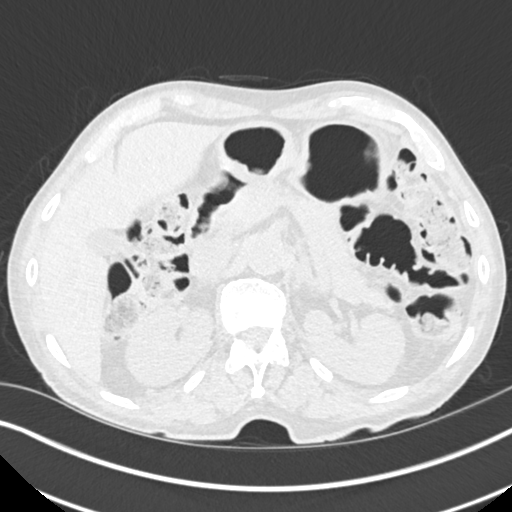
[im 22/140  lung]
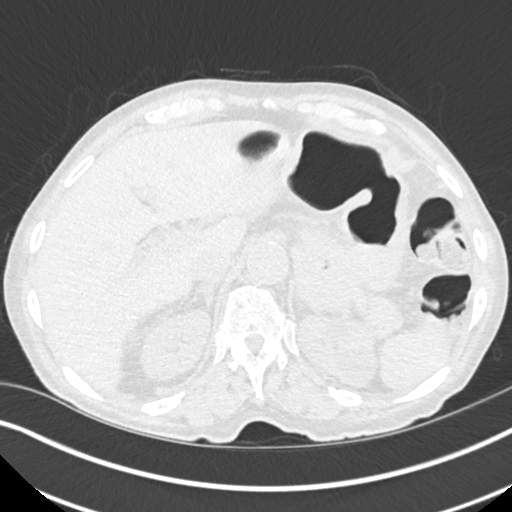
[im 33/140  lung]
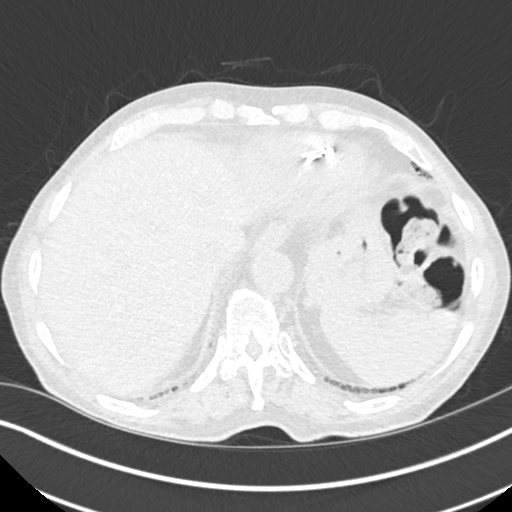
[im 43/140  lung]
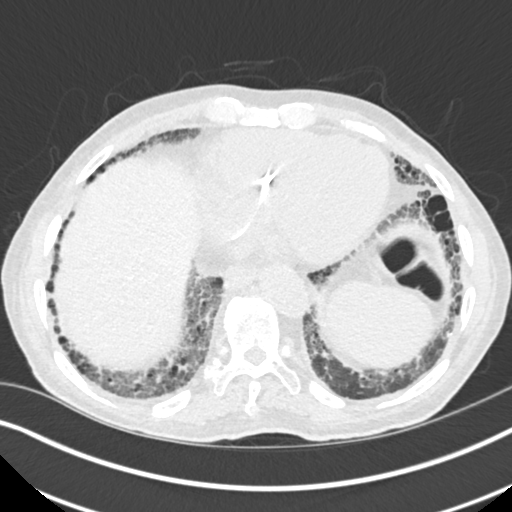
[im 54/140  mediastinal]
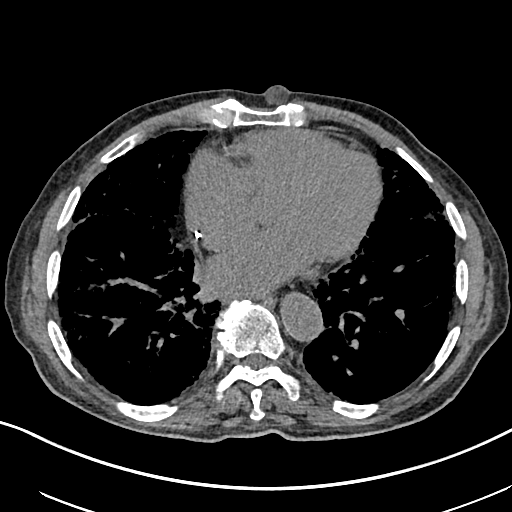
[im 54/140  lung]
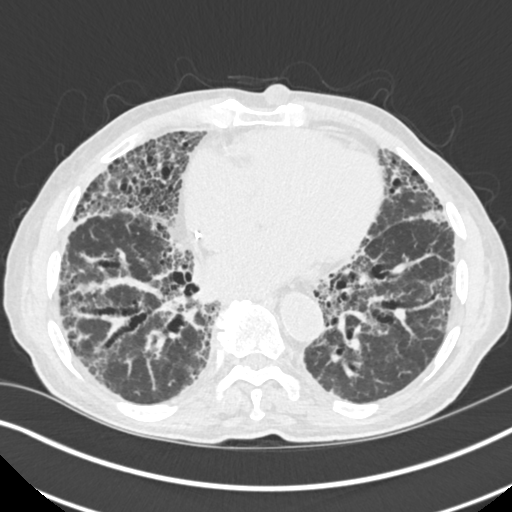
[im 75/140  lung]
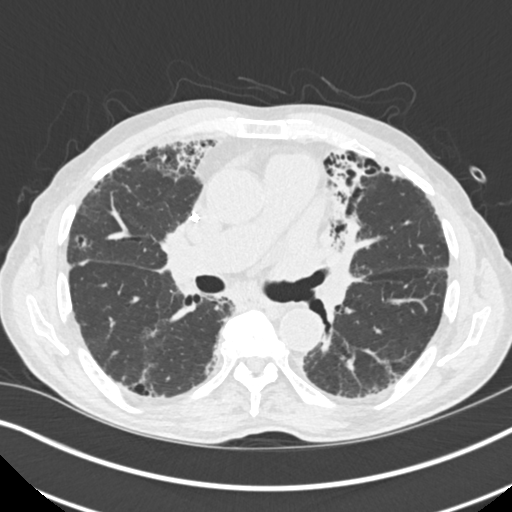
[im 86/140  lung]
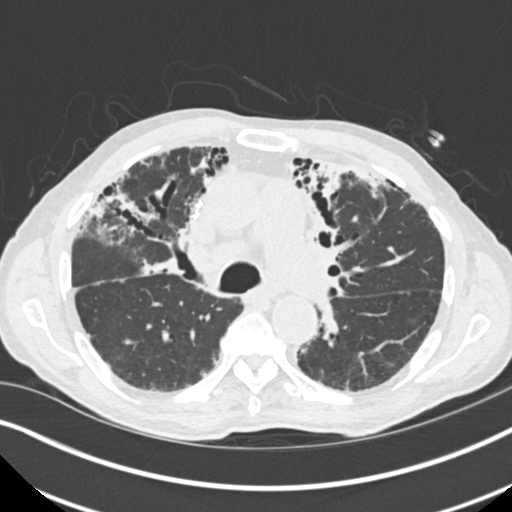
[im 97/140  lung]
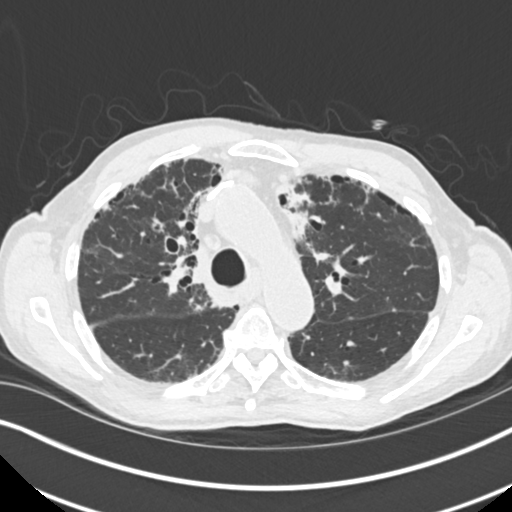
[im 107/140  mediastinal]
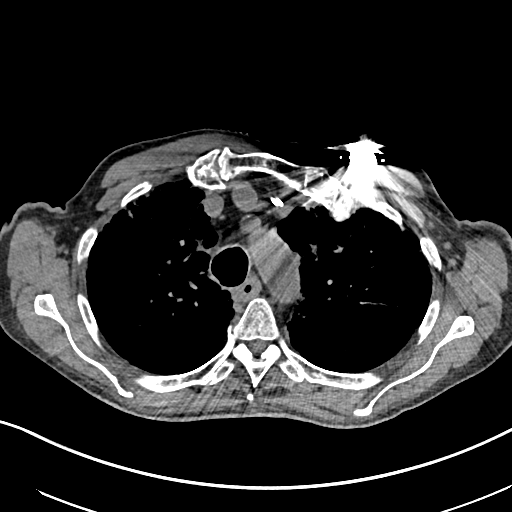
[im 107/140  lung]
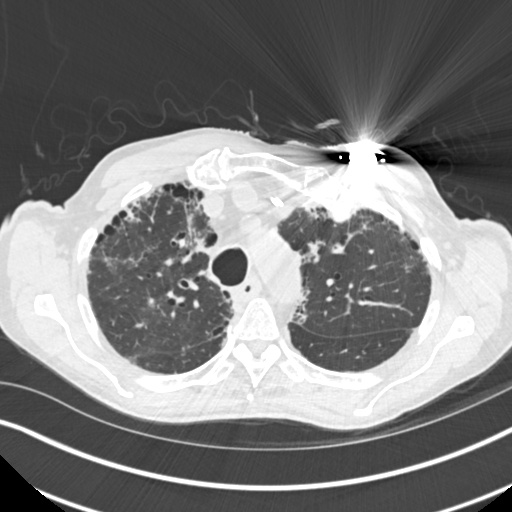
[im 118/140  lung]
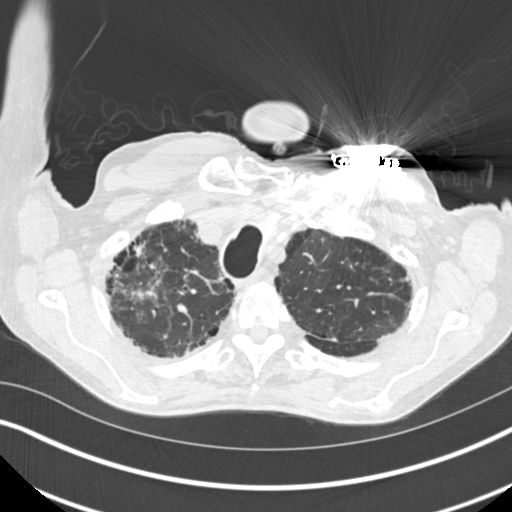
[im 129/140  lung]
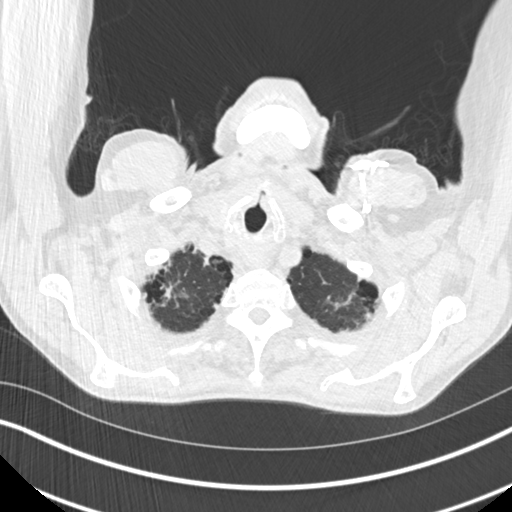

[Series 5: chest · coronal · 0.55mm/px · 3 of 119 slices shown (2 of 2)]
[im 24/119  lung]
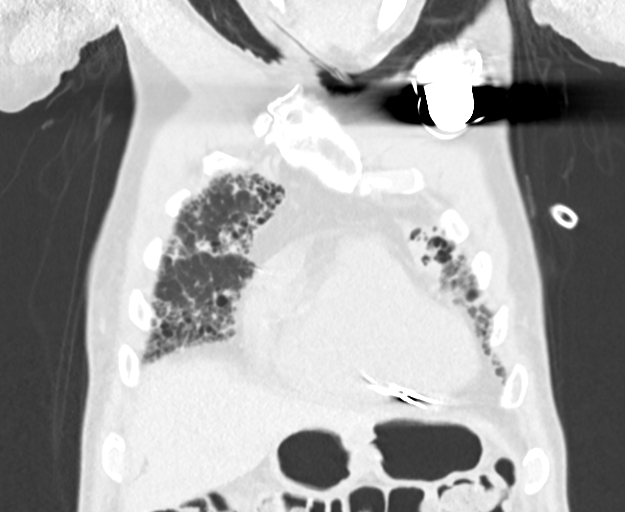
[im 48/119  lung]
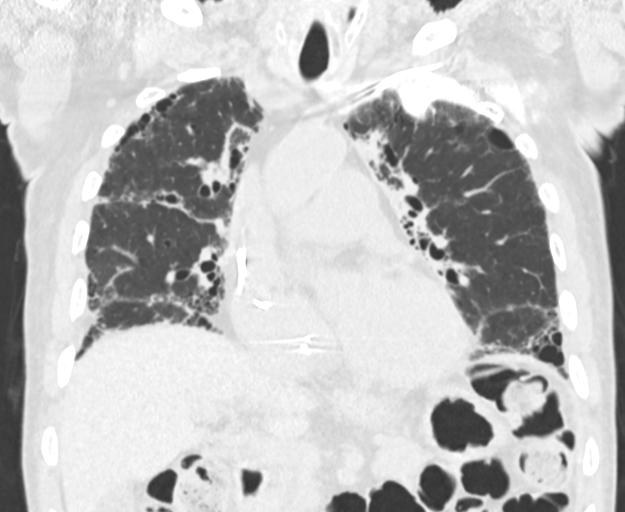
[im 71/119  lung]
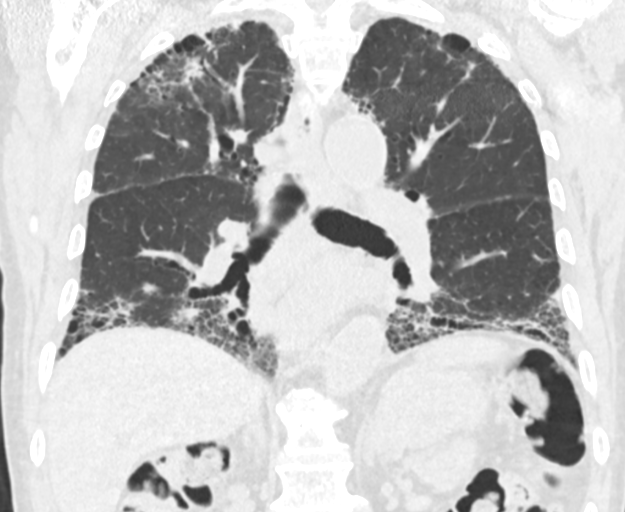

[14 of 36 positions shown; findings below may reference images not displayed]

FINDINGS: Cardiovascular: Mild atherosclerosis of the aorta, great vessels and
coronary arteries. There is central enlargement of the pulmonary
arteries consistent with pulmonary arterial hypertension. Left
subclavian dual lead pacemaker and mild cardiomegaly again noted. No
significant pericardial effusion.

Mediastinum/Nodes: There are no enlarged mediastinal, hilar or
axillary lymph nodes.Small mediastinal lymph nodes are stable. Hilar
assessment is limited by the lack of intravenous contrast, although
the hilar contours appear unchanged. The thyroid gland, trachea and
esophagus demonstrate no significant findings.

Lungs/Pleura: There is no pleural effusion or pneumothorax. There is
slightly progressive extensive chronic lung disease with traction
bronchiectasis, subpleural reticulation, architectural distortion
and honeycomb formation in the right middle lobe, lingula and both
lung bases. There is new ill-defined ground-glass density at the
right apex which is probably inflammatory (image [DATE]). There is a
more focal left lower lobe ground-glass nodule measuring 10 mm on
image 91/3.

Upper abdomen: Cholelithiasis and mild aortic atherosclerosis noted.
The visualized liver appears normal. No adrenal mass.

Musculoskeletal/Chest wall: There is no chest wall mass or
suspicious osseous finding. Mild thoracic spondylosis. Exophytic
subcutaneous lesion anterior to the lower sternum measuring 18 mm on
image 87/2, likely an epidermal inclusion (sebaceous) cyst.
IMPRESSION: 1. Progressive chronic lung disease from 7164 with bronchiectasis,
architectural distortion, subpleural reticulation and honeycomb
formation.
2. New patchy right apical opacity may be inflammatory. New left
lower lobe ground-glass nodule appears more focal and is
nonspecific. Recommend CT follow-up at 6-12 months to assess
persistence. This recommendation follows the consensus statement:
Guidelines for Management of Small Pulmonary Nodules Detected on CT
Images: From the [HOSPITAL] 7164; Radiology 7164;
[DATE].
3. Central enlargement of the pulmonary arteries consistent with
pulmonary arterial hypertension.
4. Coronary artery atherosclerosis.
